# Patient Record
Sex: Female | Born: 1955
Health system: Southern US, Community
[De-identification: ages and names within clinical notes are randomized; demographics above are authoritative.]

## PROBLEM LIST (undated history)

## (undated) DIAGNOSIS — E669 Obesity, unspecified: Secondary | ICD-10-CM

## (undated) DIAGNOSIS — M797 Fibromyalgia: Secondary | ICD-10-CM

## (undated) DIAGNOSIS — R112 Nausea with vomiting, unspecified: Secondary | ICD-10-CM

## (undated) DIAGNOSIS — D510 Vitamin B12 deficiency anemia due to intrinsic factor deficiency: Secondary | ICD-10-CM

## (undated) DIAGNOSIS — F32A Depression, unspecified: Secondary | ICD-10-CM

## (undated) DIAGNOSIS — F329 Major depressive disorder, single episode, unspecified: Secondary | ICD-10-CM

## (undated) DIAGNOSIS — I10 Essential (primary) hypertension: Secondary | ICD-10-CM

## (undated) DIAGNOSIS — K8020 Calculus of gallbladder without cholecystitis without obstruction: Secondary | ICD-10-CM

## (undated) DIAGNOSIS — K2270 Barrett's esophagus without dysplasia: Secondary | ICD-10-CM

## (undated) DIAGNOSIS — Z9889 Other specified postprocedural states: Secondary | ICD-10-CM

## (undated) DIAGNOSIS — M1990 Unspecified osteoarthritis, unspecified site: Secondary | ICD-10-CM

## (undated) HISTORY — DX: Calculus of gallbladder without cholecystitis without obstruction: K80.20

## (undated) HISTORY — PX: EYE SURGERY: SHX253

## (undated) HISTORY — PX: TONSILLECTOMY: SUR1361

## (undated) HISTORY — PX: BREAST CYST EXCISION: SHX579

## (undated) HISTORY — PX: BARIATRIC SURGERY: SHX1103

## (undated) HISTORY — PX: ROTATOR CUFF REPAIR: SHX139

## (undated) HISTORY — DX: Obesity, unspecified: E66.9

## (undated) HISTORY — PX: OTHER SURGICAL HISTORY: SHX169

## (undated) HISTORY — DX: Fibromyalgia: M79.7

## (undated) HISTORY — DX: Essential (primary) hypertension: I10

## (undated) HISTORY — PX: TOTAL KNEE ARTHROPLASTY: SHX125

## (undated) HISTORY — DX: Barrett's esophagus without dysplasia: K22.70

## (undated) HISTORY — PX: VAGINAL HYSTERECTOMY: SUR661

## (undated) HISTORY — PX: CHOLECYSTECTOMY: SHX55

## (undated) HISTORY — DX: Vitamin B12 deficiency anemia due to intrinsic factor deficiency: D51.0

## (undated) HISTORY — DX: Unspecified osteoarthritis, unspecified site: M19.90

## (undated) HISTORY — PX: APPENDECTOMY: SHX54

## (undated) HISTORY — PX: CATARACT EXTRACTION: SUR2

---

## 1997-09-22 ENCOUNTER — Encounter: Payer: Self-pay | Admitting: Internal Medicine

## 2000-03-15 ENCOUNTER — Encounter (INDEPENDENT_AMBULATORY_CARE_PROVIDER_SITE_OTHER): Payer: Self-pay

## 2000-03-15 ENCOUNTER — Other Ambulatory Visit: Admission: RE | Admit: 2000-03-15 | Discharge: 2000-03-15 | Payer: Self-pay | Admitting: Internal Medicine

## 2000-04-18 ENCOUNTER — Ambulatory Visit (HOSPITAL_BASED_OUTPATIENT_CLINIC_OR_DEPARTMENT_OTHER): Admission: RE | Admit: 2000-04-18 | Discharge: 2000-04-18 | Payer: Self-pay | Admitting: Orthopedic Surgery

## 2003-02-19 ENCOUNTER — Inpatient Hospital Stay (HOSPITAL_COMMUNITY): Admission: RE | Admit: 2003-02-19 | Discharge: 2003-02-22 | Payer: Self-pay | Admitting: Orthopedic Surgery

## 2003-02-19 ENCOUNTER — Encounter: Payer: Self-pay | Admitting: Orthopedic Surgery

## 2004-01-07 ENCOUNTER — Ambulatory Visit (HOSPITAL_COMMUNITY): Admission: RE | Admit: 2004-01-07 | Discharge: 2004-01-07 | Payer: Self-pay | Admitting: Orthopedic Surgery

## 2004-05-01 ENCOUNTER — Emergency Department (HOSPITAL_COMMUNITY): Admission: EM | Admit: 2004-05-01 | Discharge: 2004-05-01 | Payer: Self-pay

## 2004-08-09 ENCOUNTER — Ambulatory Visit: Payer: Self-pay | Admitting: Internal Medicine

## 2004-08-25 ENCOUNTER — Ambulatory Visit: Payer: Self-pay | Admitting: Internal Medicine

## 2004-08-31 ENCOUNTER — Ambulatory Visit: Payer: Self-pay | Admitting: Internal Medicine

## 2004-10-19 ENCOUNTER — Ambulatory Visit: Payer: Self-pay | Admitting: Internal Medicine

## 2004-12-01 ENCOUNTER — Encounter (INDEPENDENT_AMBULATORY_CARE_PROVIDER_SITE_OTHER): Payer: Self-pay | Admitting: Specialist

## 2004-12-01 ENCOUNTER — Ambulatory Visit: Payer: Self-pay | Admitting: Internal Medicine

## 2005-01-09 ENCOUNTER — Ambulatory Visit: Payer: Self-pay | Admitting: Internal Medicine

## 2005-08-14 ENCOUNTER — Ambulatory Visit: Payer: Self-pay | Admitting: Internal Medicine

## 2005-08-15 ENCOUNTER — Ambulatory Visit: Payer: Self-pay | Admitting: Internal Medicine

## 2005-08-22 ENCOUNTER — Ambulatory Visit: Payer: Self-pay | Admitting: Internal Medicine

## 2005-08-23 ENCOUNTER — Ambulatory Visit: Payer: Self-pay | Admitting: Internal Medicine

## 2005-11-01 ENCOUNTER — Ambulatory Visit: Payer: Self-pay | Admitting: Internal Medicine

## 2006-09-21 ENCOUNTER — Ambulatory Visit: Payer: Self-pay | Admitting: Internal Medicine

## 2006-09-21 LAB — CONVERTED CEMR LAB
ALT: 39 units/L (ref 0–40)
AST: 25 units/L (ref 0–37)
Albumin: 3.4 g/dL — ABNORMAL LOW (ref 3.5–5.2)
Alkaline Phosphatase: 155 units/L — ABNORMAL HIGH (ref 39–117)
BUN: 9 mg/dL (ref 6–23)
Basophils Absolute: 0 10*3/uL (ref 0.0–0.1)
Basophils Relative: 0.3 % (ref 0.0–1.0)
Bilirubin, Direct: 0.1 mg/dL (ref 0.0–0.3)
CO2: 29 meq/L (ref 19–32)
Calcium: 8.7 mg/dL (ref 8.4–10.5)
Chloride: 109 meq/L (ref 96–112)
Cholesterol: 143 mg/dL (ref 0–200)
Creatinine, Ser: 0.6 mg/dL (ref 0.4–1.2)
Eosinophils Absolute: 0.2 10*3/uL (ref 0.0–0.6)
Eosinophils Relative: 3.4 % (ref 0.0–5.0)
Ferritin: 27.7 ng/mL (ref 10.0–291.0)
Folate: 11.5 ng/mL
GFR calc Af Amer: 136 mL/min
GFR calc non Af Amer: 112 mL/min
Glucose, Bld: 79 mg/dL (ref 70–99)
HCT: 41.1 % (ref 36.0–46.0)
HDL: 52.6 mg/dL (ref >39.0)
Hemoglobin: 13.9 g/dL (ref 12.0–15.0)
Hgb A1c MFr Bld: 4.8 % (ref 4.6–6.0)
Iron: 99 ug/dL (ref 42–145)
LDL Cholesterol: 76 mg/dL (ref 0–99)
Lymphocytes Relative: 29.7 % (ref 12.0–46.0)
MCHC: 33.7 g/dL (ref 30.0–36.0)
MCV: 90.2 fL (ref 78.0–100.0)
Monocytes Absolute: 0.4 10*3/uL (ref 0.2–0.7)
Monocytes Relative: 7.7 % (ref 3.0–11.0)
Neutro Abs: 3.3 10*3/uL (ref 1.4–7.7)
Neutrophils Relative %: 58.9 % (ref 43.0–77.0)
Platelets: 272 10*3/uL (ref 150–400)
Potassium: 3.4 meq/L — ABNORMAL LOW (ref 3.5–5.1)
RBC: 4.56 M/uL (ref 3.87–5.11)
RDW: 12.5 % (ref 11.5–14.6)
Saturation Ratios: 29.8 % (ref 20.0–50.0)
Sodium: 143 meq/L (ref 135–145)
TSH: 1.72 microintl units/mL (ref 0.35–5.50)
Total Bilirubin: 0.9 mg/dL (ref 0.3–1.2)
Total CHOL/HDL Ratio: 2.7
Total Protein: 6.3 g/dL (ref 6.0–8.3)
Transferrin: 237 mg/dL (ref >=212.0)
Triglycerides: 71 mg/dL (ref 0–149)
VLDL: 14 mg/dL (ref 0–40)
Vitamin B-12: 165 pg/mL — ABNORMAL LOW (ref 211–911)
WBC: 5.6 10*3/uL (ref 4.5–10.5)

## 2006-09-22 ENCOUNTER — Encounter: Payer: Self-pay | Admitting: Internal Medicine

## 2006-09-22 LAB — CONVERTED CEMR LAB: Zinc: 863 (ref 600–1200)

## 2006-09-25 ENCOUNTER — Ambulatory Visit: Payer: Self-pay | Admitting: Internal Medicine

## 2006-09-28 ENCOUNTER — Ambulatory Visit: Payer: Self-pay | Admitting: Internal Medicine

## 2006-10-22 ENCOUNTER — Ambulatory Visit: Payer: Self-pay | Admitting: Internal Medicine

## 2006-12-13 ENCOUNTER — Encounter: Payer: Self-pay | Admitting: Internal Medicine

## 2006-12-13 DIAGNOSIS — I10 Essential (primary) hypertension: Secondary | ICD-10-CM | POA: Insufficient documentation

## 2006-12-13 DIAGNOSIS — J309 Allergic rhinitis, unspecified: Secondary | ICD-10-CM | POA: Insufficient documentation

## 2006-12-13 DIAGNOSIS — K449 Diaphragmatic hernia without obstruction or gangrene: Secondary | ICD-10-CM | POA: Insufficient documentation

## 2006-12-13 DIAGNOSIS — K219 Gastro-esophageal reflux disease without esophagitis: Secondary | ICD-10-CM | POA: Insufficient documentation

## 2007-01-21 ENCOUNTER — Ambulatory Visit: Payer: Self-pay | Admitting: Internal Medicine

## 2007-01-21 LAB — CONVERTED CEMR LAB
GGT: 8 units/L (ref 7–51)
Vit D, 1,25-Dihydroxy: 8 — ABNORMAL LOW (ref 20–57)

## 2007-01-22 LAB — CONVERTED CEMR LAB
ALT: 24 units/L (ref 0–35)
AST: 21 units/L (ref 0–37)
Albumin: 3.4 g/dL — ABNORMAL LOW (ref 3.5–5.2)
Alkaline Phosphatase: 148 units/L — ABNORMAL HIGH (ref 39–117)
Bilirubin, Direct: 0.1 mg/dL (ref 0.0–0.3)
Potassium: 4.2 meq/L (ref 3.5–5.1)
Total Bilirubin: 0.6 mg/dL (ref 0.3–1.2)
Total Protein: 6 g/dL (ref 6.0–8.3)
Vitamin B-12: 841 pg/mL (ref 211–911)

## 2007-02-03 DIAGNOSIS — M2550 Pain in unspecified joint: Secondary | ICD-10-CM | POA: Insufficient documentation

## 2007-02-03 DIAGNOSIS — F411 Generalized anxiety disorder: Secondary | ICD-10-CM | POA: Insufficient documentation

## 2007-02-03 DIAGNOSIS — Z8679 Personal history of other diseases of the circulatory system: Secondary | ICD-10-CM | POA: Insufficient documentation

## 2007-02-03 DIAGNOSIS — E538 Deficiency of other specified B group vitamins: Secondary | ICD-10-CM | POA: Insufficient documentation

## 2007-02-03 DIAGNOSIS — K2270 Barrett's esophagus without dysplasia: Secondary | ICD-10-CM | POA: Insufficient documentation

## 2007-02-04 ENCOUNTER — Ambulatory Visit: Payer: Self-pay | Admitting: Internal Medicine

## 2007-02-04 DIAGNOSIS — E559 Vitamin D deficiency, unspecified: Secondary | ICD-10-CM | POA: Insufficient documentation

## 2007-02-04 DIAGNOSIS — R030 Elevated blood-pressure reading, without diagnosis of hypertension: Secondary | ICD-10-CM | POA: Insufficient documentation

## 2007-04-10 DIAGNOSIS — J180 Bronchopneumonia, unspecified organism: Secondary | ICD-10-CM | POA: Insufficient documentation

## 2007-04-11 ENCOUNTER — Ambulatory Visit: Payer: Self-pay | Admitting: Family Medicine

## 2007-04-15 ENCOUNTER — Telehealth: Payer: Self-pay | Admitting: Family Medicine

## 2007-04-18 ENCOUNTER — Encounter: Payer: Self-pay | Admitting: Internal Medicine

## 2007-06-03 ENCOUNTER — Ambulatory Visit: Payer: Self-pay | Admitting: Internal Medicine

## 2007-06-03 DIAGNOSIS — J029 Acute pharyngitis, unspecified: Secondary | ICD-10-CM | POA: Insufficient documentation

## 2007-06-03 DIAGNOSIS — D649 Anemia, unspecified: Secondary | ICD-10-CM | POA: Insufficient documentation

## 2007-06-03 DIAGNOSIS — R748 Abnormal levels of other serum enzymes: Secondary | ICD-10-CM | POA: Insufficient documentation

## 2007-06-03 LAB — CONVERTED CEMR LAB
ALT: 22 units/L (ref 0–35)
AST: 20 units/L (ref 0–37)
Albumin: 3.6 g/dL (ref 3.5–5.2)
Alkaline Phosphatase: 131 units/L — ABNORMAL HIGH (ref 39–117)
Basophils Absolute: 0 10*3/uL (ref 0.0–0.1)
Basophils Relative: 0.5 % (ref 0.0–1.0)
Bilirubin, Direct: 0.2 mg/dL (ref 0.0–0.3)
Eosinophils Absolute: 0.1 10*3/uL (ref 0.0–0.6)
Eosinophils Relative: 2 % (ref 0.0–5.0)
Ferritin: 44.4 ng/mL (ref 10.0–291.0)
HCT: 37.4 % (ref 36.0–46.0)
Hemoglobin: 12.9 g/dL (ref 12.0–15.0)
Lymphocytes Relative: 24 % (ref 12.0–46.0)
MCHC: 34.5 g/dL (ref 30.0–36.0)
MCV: 88.8 fL (ref 78.0–100.0)
Monocytes Absolute: 0.9 10*3/uL — ABNORMAL HIGH (ref 0.2–0.7)
Monocytes Relative: 12.4 % — ABNORMAL HIGH (ref 3.0–11.0)
Neutro Abs: 4.7 10*3/uL (ref 1.4–7.7)
Neutrophils Relative %: 61.1 % (ref 43.0–77.0)
Platelets: 236 10*3/uL (ref 150–400)
RBC: 4.21 M/uL (ref 3.87–5.11)
RDW: 12.5 % (ref 11.5–14.6)
Rapid Strep: NEGATIVE
Total Bilirubin: 0.8 mg/dL (ref 0.3–1.2)
Total Protein: 6.5 g/dL (ref 6.0–8.3)
Vitamin B-12: 995 pg/mL — ABNORMAL HIGH (ref 211–911)
WBC: 7.5 10*3/uL (ref 4.5–10.5)

## 2007-06-11 LAB — CONVERTED CEMR LAB: Vit D, 1,25-Dihydroxy: 12 — ABNORMAL LOW (ref 30–89)

## 2007-08-30 ENCOUNTER — Ambulatory Visit: Payer: Self-pay | Admitting: Internal Medicine

## 2007-08-30 LAB — CONVERTED CEMR LAB: Vit D, 1,25-Dihydroxy: 24 — ABNORMAL LOW (ref 30–89)

## 2007-09-05 ENCOUNTER — Ambulatory Visit: Payer: Self-pay | Admitting: Internal Medicine

## 2007-09-24 ENCOUNTER — Encounter: Payer: Self-pay | Admitting: Internal Medicine

## 2007-10-02 ENCOUNTER — Telehealth: Payer: Self-pay | Admitting: Internal Medicine

## 2007-10-03 ENCOUNTER — Ambulatory Visit: Payer: Self-pay | Admitting: Internal Medicine

## 2007-10-03 DIAGNOSIS — M25519 Pain in unspecified shoulder: Secondary | ICD-10-CM | POA: Insufficient documentation

## 2007-10-03 DIAGNOSIS — M797 Fibromyalgia: Secondary | ICD-10-CM | POA: Insufficient documentation

## 2007-10-04 ENCOUNTER — Telehealth: Payer: Self-pay | Admitting: Internal Medicine

## 2007-10-21 ENCOUNTER — Telehealth: Payer: Self-pay | Admitting: Internal Medicine

## 2007-10-23 ENCOUNTER — Ambulatory Visit: Payer: Self-pay | Admitting: Internal Medicine

## 2007-11-06 ENCOUNTER — Encounter: Admission: RE | Admit: 2007-11-06 | Discharge: 2007-11-06 | Payer: Self-pay | Admitting: Obstetrics and Gynecology

## 2007-11-19 ENCOUNTER — Observation Stay (HOSPITAL_COMMUNITY): Admission: RE | Admit: 2007-11-19 | Discharge: 2007-11-20 | Payer: Self-pay | Admitting: Obstetrics and Gynecology

## 2007-11-19 ENCOUNTER — Encounter (INDEPENDENT_AMBULATORY_CARE_PROVIDER_SITE_OTHER): Payer: Self-pay | Admitting: Obstetrics and Gynecology

## 2007-12-02 ENCOUNTER — Ambulatory Visit: Payer: Self-pay | Admitting: Internal Medicine

## 2007-12-02 LAB — CONVERTED CEMR LAB: Vit D, 1,25-Dihydroxy: 15 — ABNORMAL LOW (ref 30–89)

## 2007-12-03 ENCOUNTER — Encounter: Payer: Self-pay | Admitting: Internal Medicine

## 2007-12-05 LAB — CONVERTED CEMR LAB
ALT: 40 units/L — ABNORMAL HIGH (ref 0–35)
AST: 25 units/L (ref 0–37)
Albumin: 3.2 g/dL — ABNORMAL LOW (ref 3.5–5.2)
Alkaline Phosphatase: 93 units/L (ref 39–117)
BUN: 18 mg/dL (ref 6–23)
Bilirubin, Direct: 0.1 mg/dL (ref 0.0–0.3)
CO2: 29 meq/L (ref 19–32)
Calcium: 8.9 mg/dL (ref 8.4–10.5)
Chloride: 113 meq/L — ABNORMAL HIGH (ref 96–112)
Creatinine, Ser: 0.7 mg/dL (ref 0.4–1.2)
Free T4: 0.8 ng/dL (ref 0.6–1.6)
GFR calc Af Amer: 113 mL/min
GFR calc non Af Amer: 93 mL/min
Glucose, Bld: 93 mg/dL (ref 70–99)
Potassium: 3.7 meq/L (ref 3.5–5.1)
Sodium: 144 meq/L (ref 135–145)
TSH: 2.26 microintl units/mL (ref 0.35–5.50)
Total Bilirubin: 0.6 mg/dL (ref 0.3–1.2)
Total Protein: 5.8 g/dL — ABNORMAL LOW (ref 6.0–8.3)

## 2007-12-11 ENCOUNTER — Ambulatory Visit: Payer: Self-pay | Admitting: Internal Medicine

## 2008-01-07 ENCOUNTER — Telehealth (INDEPENDENT_AMBULATORY_CARE_PROVIDER_SITE_OTHER): Payer: Self-pay | Admitting: *Deleted

## 2008-01-09 ENCOUNTER — Ambulatory Visit (HOSPITAL_BASED_OUTPATIENT_CLINIC_OR_DEPARTMENT_OTHER): Admission: RE | Admit: 2008-01-09 | Discharge: 2008-01-09 | Payer: Self-pay | Admitting: Orthopedic Surgery

## 2008-07-26 ENCOUNTER — Emergency Department (HOSPITAL_COMMUNITY): Admission: EM | Admit: 2008-07-26 | Discharge: 2008-07-26 | Payer: Self-pay | Admitting: Emergency Medicine

## 2008-09-29 ENCOUNTER — Telehealth: Payer: Self-pay | Admitting: Internal Medicine

## 2008-10-07 ENCOUNTER — Ambulatory Visit: Payer: Self-pay | Admitting: Internal Medicine

## 2008-10-12 LAB — CONVERTED CEMR LAB
Mumps IgG: 2.72 — ABNORMAL HIGH
Rubella: 352 intl units/mL — ABNORMAL HIGH
Rubeola IgG: 2.41 — ABNORMAL HIGH
Varicella IgG: 3.57 — ABNORMAL HIGH

## 2008-10-28 ENCOUNTER — Encounter: Admission: RE | Admit: 2008-10-28 | Discharge: 2008-10-28 | Payer: Self-pay | Admitting: Orthopedic Surgery

## 2008-12-31 ENCOUNTER — Ambulatory Visit (HOSPITAL_COMMUNITY): Admission: RE | Admit: 2008-12-31 | Discharge: 2008-12-31 | Payer: Self-pay | Admitting: Orthopaedic Surgery

## 2009-02-01 ENCOUNTER — Telehealth: Payer: Self-pay | Admitting: *Deleted

## 2009-05-06 ENCOUNTER — Telehealth: Payer: Self-pay | Admitting: *Deleted

## 2009-09-06 ENCOUNTER — Ambulatory Visit: Payer: Self-pay | Admitting: Internal Medicine

## 2009-09-15 ENCOUNTER — Encounter: Payer: Self-pay | Admitting: Internal Medicine

## 2009-09-16 ENCOUNTER — Encounter: Payer: Self-pay | Admitting: Internal Medicine

## 2009-10-12 ENCOUNTER — Telehealth: Payer: Self-pay | Admitting: *Deleted

## 2009-10-18 ENCOUNTER — Ambulatory Visit: Payer: Self-pay | Admitting: Internal Medicine

## 2009-10-18 DIAGNOSIS — F4322 Adjustment disorder with anxiety: Secondary | ICD-10-CM | POA: Insufficient documentation

## 2009-10-18 DIAGNOSIS — R002 Palpitations: Secondary | ICD-10-CM | POA: Insufficient documentation

## 2009-11-19 ENCOUNTER — Ambulatory Visit: Payer: Self-pay | Admitting: Internal Medicine

## 2010-03-04 ENCOUNTER — Encounter: Admission: RE | Admit: 2010-03-04 | Discharge: 2010-03-04 | Payer: Self-pay | Admitting: Obstetrics and Gynecology

## 2010-04-07 ENCOUNTER — Encounter: Payer: Self-pay | Admitting: *Deleted

## 2010-04-07 ENCOUNTER — Telehealth: Payer: Self-pay | Admitting: *Deleted

## 2010-05-17 ENCOUNTER — Telehealth: Payer: Self-pay | Admitting: *Deleted

## 2010-08-07 ENCOUNTER — Encounter: Payer: Self-pay | Admitting: Orthopedic Surgery

## 2010-08-10 ENCOUNTER — Encounter: Payer: Self-pay | Admitting: *Deleted

## 2010-08-16 NOTE — Assessment & Plan Note (Signed)
Summary: fever/congestion/njr   Vital Signs:  Patient Profile:   55 Years Old Female Weight:      193 pounds (87.73 kg) Temp:     98.7 degrees F (37.06 degrees C) oral BP sitting:   150 / 82  (right arm)  Pt. in pain?   no  Vitals Entered By: Arcola Jansky, RN (April 11, 2007 12:26 PM)              Is Patient Diabetic? No     Chief Complaint:  last week had sore throat, ear ache, H/A, coughing then it got better ;    7-8pm yest .Temp. 101-103, chills, cough, runny nose, H/A, body aches, and sputum yellow.  History of Present Illness: Melanie [name] is a 55 year old female, who comes in today for evaluation of a fever.  Darl Pikes 916 2000, H. developed a sore throat, and congestion cold-like symptoms.  She was well until yesterday when she did spike a fever for hundred .3 and had increasing cough and chills.  She said since to yellow sputum production, but no blood.  She has no chest pain, earache, sore throat, nausea, vomiting, diarrhea, or urinary tract symptoms.  She says a number two coworker been sick everybody home as been well.  She is an ex-smoker  Acute Visit History:      She denies abdominal pain, chest pain, constipation, cough, diarrhea, earache, eye symptoms, fever, genitourinary symptoms, headache, musculoskeletal symptoms, nasal discharge, nausea, rash, sinus problems, sore throat, and vomiting.         Current Allergies (reviewed today): ! PCN     Review of Systems      See HPI   Physical Exam  General:     Well-developed,well-nourished,in no acute distress; alert,appropriate and cooperative throughout examination Head:     Normocephalic and atraumatic without obvious abnormalities. No apparent alopecia or balding. Eyes:     No corneal or conjunctival inflammation noted. EOMI. Perrla. Funduscopic exam benign, without hemorrhages, exudates or papilledema. Vision grossly normal. Ears:     External ear exam shows no significant lesions or deformities.   Otoscopic examination reveals clear canals, tympanic membranes are intact bilaterally without bulging, retraction, inflammation or discharge. Hearing is grossly normal bilaterally. Nose:     External nasal examination shows no deformity or inflammation. Nasal mucosa are pink and moist without lesions or exudates. Mouth:     Oral mucosa and oropharynx without lesions or exudates.  Teeth in good repair. Neck:     No deformities, masses, or tenderness noted. Chest Wall:     No deformities, masses, or tenderness noted. Lungs:     good breath sounds crackles, left base.  Chest x-ray normal Cervical Nodes:     No lymphadenopathy noted    Impression & Recommendations:  Problem # 1:  BRONCHOPNEUMONIA, ORGANISM NOS (ICD-485) Assessment: New  Her updated medication list for this problem includes:    Biaxin 500 Mg Tabs (Clarithromycin) .Marland Kitchen... Take 1 tablet by mouth two times a day   Complete Medication List: 1)  Cyanocobalamin 1000 Mcg/ml Soln (Cyanocobalamin) .... Administer 1 ml intramuscularly as directed 2)  Flexeril 10 Mg Tabs (Cyclobenzaprine hcl) .... Take as directed 3)  Monoject Safety Syringe/shield 23g X 1" 3 Ml Misc (Syringe/needle (disp)) .... Use as directed 4)  Paroxetine Hcl 40 Mg Tabs (Paroxetine hcl) .... Take 1 tablet by mouth once a day 5)  Tgt Alcohol Swabs 70 % Pads (Alcohol swabs) .... Apply as directed 6)  Vitamin D 16109 Unit  Caps (Ergocalciferol) .Marland Kitchen.. 1 by mouth once a week 7)  Biaxin 500 Mg Tabs (Clarithromycin) .... Take 1 tablet by mouth two times a day 8)  Hycodan 5-1.5 Mg/57ml Syrp (Hydrocodone-homatropine) .... 2 tsps tid   Patient Instructions: 1)  I think youdeveloped a secondary pneumonia from the underlying viralr illness.  Drink lots of liquids, rest at home, take Tylenol or aspirin for her fever and chills.  We will begin Biaxin 500 mg b.i.d. and Hycodan cough syrup one to 2 teaspoons t.i.d.  I think he should stay at home and rest today, Friday,  Saturday, and Sunday, and I think by Monday you should be able to go back to work.  Will give you a note to that effect.  Return p.r.n. if any problems    Prescriptions: HYCODAN 5-1.5 MG/5ML  SYRP (HYDROCODONE-HOMATROPINE) 2 tsps tid  #4 0z. x 2   Entered and Authorized by:   Roderick Pee MD   Signed by:   Roderick Pee MD on 04/11/2007   Method used:   Print then Give to Patient   RxID:   [id number] BIAXIN 500 MG  TABS (CLARITHROMYCIN) Take 1 tablet by mouth two times a day  #20 x 1   Entered and Authorized by:   Roderick Pee MD   Signed by:   Roderick Pee MD on 04/11/2007   Method used:   Print then Give to Patient   RxID:   [id number]  ]

## 2010-08-16 NOTE — Progress Notes (Signed)
Summary: fibromyalgia pain   Phone Note Call from Patient Call back at [phone]   Caller: patient vm triage Call For: Panosh  Summary of Call: Having a bad flair up of fibromyalgia.  Could Dr Fabian Sharp call in something for pain.  [facility]  Initial call taken by: Roselle Locus,  October 02, 2007 8:39 AM  Follow-up for Phone Call        please get more informtion clinical ? fever ? what hurts  .Pt may need to have ov   .   there is no standard med for FM if that is her dx  Follow-up by: wanda panosh  md   Additional Follow-up for Phone Call Additional follow up Details #1::        Pt is hurting all over and is convinced it is fibromylagia pain...Marland KitchenMarland KitchenWants pain meds to go with Flexeril.  Scheduled an office visit wth Dr. Fabian Sharp. Additional Follow-up by: Lynann Beaver CMA,  October 03, 2007 8:34 AM

## 2010-08-16 NOTE — Progress Notes (Signed)
Summary: needs titer for school  Phone Note Call from Patient Call back at Home Phone [phone]   Caller: Patient Summary of Call: Pt is needing a titer done for MMR for school. Is there any way we can do it here without her seeing md? Pt has no insurance right now and can't afford an office visit. Initial call taken by: Romualdo Bolk, CMA,  September 29, 2008 1:07 PM  Follow-up for Phone Call        ok to do but needs mor information to decide what titer to do . Follow-up by: [name] MD,  September 29, 2008 1:14 PM  Additional Follow-up for Phone Call Additional follow up Details #1::        Left message to call back.  Additional Follow-up by: Romualdo Bolk, CMA,  September 29, 2008 2:42 PM    Additional Follow-up for Phone Call Additional follow up Details #2::    LMTOCB Romualdo Bolk, CMA  October 07, 2008 1:01 PM Pt came in and needs both mmr titer and varicella titer.  Romualdo Bolk, CMA  October 07, 2008 4:00 PM

## 2010-08-16 NOTE — Progress Notes (Signed)
Summary: Rx refills  Phone Note Call from Patient   Reason for Call: Insurance Question Summary of Call: Patient requesting refills for her Paxil and Flexeril for 4 to 5 months until she can get a job. Patient states she has no insurance and she is about to finish school and hopes to have a job soon. Pharm/[facility]. Patient can be reached at [phone]. Initial call taken by: Darra Lis RMA,  February 01, 2009 3:50 PM  Follow-up for Phone Call        ok for 3 months  . then really needs an ov as she has not been seen in a year.  Follow-up by: [name] MD,  February 01, 2009 4:58 PM  Additional Follow-up for Phone Call Additional follow up Details #1::        LMTOCB- need to know how often she is taking the flexeril and that she needs to make an appt as soon as she can. Additional Follow-up by: Romualdo Bolk, CMA,  February 03, 2009 8:34 AM    Additional Follow-up for Phone Call Additional follow up Details #2::    Pt aware and rx sent electronically. Romualdo Bolk, CMA  February 03, 2009 1:04 PM   New/Updated Medications: PAROXETINE HCL 20 MG  TABS (PAROXETINE HCL) 2 by mouth daily Prescriptions: PAROXETINE HCL 20 MG  TABS (PAROXETINE HCL) 2 by mouth daily  #180 x 0   Entered by:   Romualdo Bolk, CMA   Authorized by:   [name] MD   Signed by:   Romualdo Bolk, CMA on 02/03/2009   Method used:   Electronically to        [facility]  [address] (retail)       [address]       [place], [place]  [address]       Ph: [phone]       Fax: [phone]   RxID:   [id number] FLEXERIL 10 MG TABS (CYCLOBENZAPRINE HCL) Take as directed  #90 x 3   Entered by:   Romualdo Bolk, CMA   Authorized by:   [name] MD   Signed by:   Romualdo Bolk, CMA on 02/03/2009   Method used:   Electronically to        [facility]  [address] [address] (retail)       [address]       [place], [place]  [address]       Ph:  [phone]       Fax: [phone]   RxID:   [id number]

## 2010-08-16 NOTE — Letter (Signed)
Summary: Generic Letter  [place] [facility]  [address] [place], [place] [address]   Phone: [phone]  Fax: [phone]    04/07/2010  Jordan Bastien [address] [place], [place]  [address]  Dear Ms. Shinsato,  We have been trying to contact you to schedule a follow up appt. In order to make sure that your are getting the correct medical care, you are due for a follow up appointment on your medication before your next refill. Please call our office at [phone] to schedule this appointment.          Sincerely,   Tor Netters, CMA (AAMA)

## 2010-08-16 NOTE — Progress Notes (Signed)
Summary: last seen 9/25, still not doing well  Phone Note Call from Patient Call back at Home Phone [phone] Call back at [phone]   Caller: Patient Call For: [name] Summary of Call: pt was in on 9/25 and seen by Dr Tawanna Cooler. pt has pneumonia and still feeling bad today C/O coughing, feeling weak, not being able to eat or drink  because of her mouth. Need to know if she need to be seen again today. Pls call concerned pt Initial call taken by: Shan Levans,  April 15, 2007 10:09 AM  Follow-up for Phone Call        patient Dr. Suzzanne Cloud I saw last week with a viral infection.  Show normal.  Chest x-ray, but crackles consistent with either early bacterial, viral or possible mycoplasma pneumonia.  She was placed and in the eye.  She is calling because her chest.  Some soreness in her mouth.  She was given Magic mouthwash to Dr. Daisey Must is doing okay.  She is afebrile and otherwise well.  Advised to return to see Dr. Suzzanne Cloud her primary care physician if she has any further questions Follow-up by: Roderick Pee MD,  April 15, 2007 12:52 PM

## 2010-08-16 NOTE — Progress Notes (Signed)
Summary: refill  Phone Note From Pharmacy   Caller: [facility]  [address] Reason for Call: Needs renewal Details for Reason: Paroxetine 20mg  Summary of Call: Pt needs a rov before next refill.  Initial call taken by: Romualdo Bolk, CMA Duncan Dull),  April 07, 2010 10:51 AM  Follow-up for Phone Call        Tried to call pt about scheduling an appt but this was the wrong number.  Tried other numbers but couldn't get ahold of her. I mailed her a letter to call us back to schedule a follow up appt. Rx sent for 1 month only. Follow-up by: Romualdo Bolk, CMA (AAMA),  April 07, 2010 1:18 PM    Prescriptions: PAROXETINE HCL 40 MG TABS (PAROXETINE HCL) Take 1 tablet by mouth once a day  #30 x 0   Entered by:   Romualdo Bolk, CMA (AAMA)   Authorized by:   [name] MD   Signed by:   Romualdo Bolk, CMA (AAMA) on 04/07/2010   Method used:   Electronically to        [facility]  [address] [address] (retail)       [address]       [place], [place]  [address]       Ph: [phone]       Fax: [phone]   RxID:   [id number]

## 2010-08-16 NOTE — Progress Notes (Signed)
Summary: allergic reaction  Phone Note Call from Patient   Caller: Patient Call For: Dr. Fabian Sharp Summary of Call: Pt. took Lyrica last night and has a rash on her chest. What should she do?  Told her to hold it until she hears from [place]. [phone] Initial call taken by: Lynann Beaver CMA,  October 04, 2007 10:31 AM  Follow-up for Phone Call        Per md- What does it look like? Does it itch? How much of her body is it on? Spoke to pt- big red whelps, it doesn't itch, and it just on the top of her chest.  Follow-up by: Romualdo Bolk, CMA,  October 04, 2007 10:48 AM  Additional Follow-up for Phone Call Additional follow up Details #1::        Per md- not sure if it's the meds, stop meds and take benadryl. If rash goes away, try again. If it comes back or you start to get SOB then d/c. Usually if having a reaction you will have itching. Dr. Fabian Sharp is on call this weekend. Pt aware of this and will call back if she needs anything. Additional Follow-up by: Romualdo Bolk, CMA,  October 04, 2007 11:05 AM

## 2010-08-16 NOTE — Progress Notes (Signed)
Summary: Office Visit  Office Visit   Imported By: Kassie Mends 12/03/2007 10:39:37  _____________________________________________________________________  External Attachment:    Type:   Image     Comment:   office note

## 2010-08-16 NOTE — Assessment & Plan Note (Signed)
Summary: discuss meds/ssc   Vital Signs:  Patient profile:   55 year old female Menstrual status:  hysterectomy Weight:      218 pounds Pulse rate:   113 / minute BP sitting:   130 / 90  (left arm) Cuff size:   large  Vitals Entered By: Romualdo Bolk, CMA (AAMA) (October 18, 2009 2:45 PM) CC: Discuss meds, Hypertension Management     Menstrual Status hysterectomy   History of Present Illness: Melanie [name] comesin today for   for above. She is reestablishing with [place] .  Last ov 5 09  She had had her check up with Dr Selena Batten at [facility] where she has worked since November but for various reasons  wishes to come back here for care.  Last ov was    REcords from Dr Reuel Derby evaluation with labs and ekg were sent to [place].   Labs nl except vit d is 9.6  was given VIt d rx but not taking this  Bp has been controlled .  Jan 2010  fracture   foot   twisted     left shoulder  bone spur  taken off June 2009 then rotator cuff  summer 2010. GI :   ? if needs egd   didnt  see on the last one.  Has Barretts esophagus  Feels panicy again  and / if  needs to bump up   her medication .   Recently     had taken CMA exam and was stressful but did ok .    Sleep ok.       Hypertension History:      She complains of palpitations, but denies headache, chest pain, dyspnea with exertion, orthopnea, PND, peripheral edema, visual symptoms, neurologic problems, syncope, and side effects from treatment.  She notes no problems with any antihypertensive medication side effects.  Heart palps like when she had panic attacks.        Positive major cardiovascular risk factors include hypertension.  Negative major cardiovascular risk factors include female age less than 50 years old and non-tobacco-user status.     Preventive Screening-Counseling & Management  Alcohol-Tobacco     Alcohol drinks/day: 0     Smoking Status: quit     Year Quit: 1994  Caffeine-Diet-Exercise     Caffeine use/day: 1     Does  Patient Exercise: yes     Type of exercise: walking  Hep-HIV-STD-Contraception     Dental Visit-last 6 months no  Safety-Violence-Falls     Seat Belt Use: 100     Firearms in the Home: firearms in the home     Firearm Counseling: not indicated; uses recommended firearm safety measures     Smoke Detectors: yes      Blood Transfusions:  no.    Current Medications (verified): 1)  Cyanocobalamin 1000 Mcg/ml Soln (Cyanocobalamin) .... Administer 1 Ml Intramuscularly As Directed 2)  Flexeril 10 Mg Tabs (Cyclobenzaprine Hcl) .... Take As Directed 3)  Monoject Safety Syringe/shield 23g X 1" 3 Ml Misc (Syringe/needle (Disp)) .... Use As Directed 4)  Paroxetine Hcl 40 Mg Tabs (Paroxetine Hcl) .... Take 1 Tablet By Mouth Once A Day 5)  Tgt Alcohol Swabs 70 % Pads (Alcohol Swabs) .... Apply As Directed 6)  Vitamin D 29562 Unit  Caps (Ergocalciferol) .Marland Kitchen.. 1 By Mouth 2 X Per Week or As Directed 7)  Maxzide-25 37.5-25 Mg  Tabs (Triamterene-Hctz) .... Take 1/2 By Mouth Once Daily or As Directed  8)  Paroxetine Hcl 20 Mg  Tabs (Paroxetine Hcl) .... 2 By Mouth Daily 9)  Vitamin B-12 1000 Mcg Subl (Cyanocobalamin)  Allergies (verified): 1)  ! Pcn 2)  * Ace Inhibitor?  Past History:  Past medical, surgical, family and social histories (including risk factors) reviewed, and no changes noted (except as noted below).  Past Medical History: Allergic rhinitis GERD Hypertension barretts esophagus fibromyalgia Low b12 from bypass Vit d deficiency   Past Surgical History: Reviewed history from 12/11/2007 and no changes required. Cholecystectomy Hysterectomy 96 endometriosis Tonsillectomy Breast Biopsy in 1990 and 1997 Ganglion Cyst removed Gastric bypass 06 mini Total knee replacement l 04 Caesarean section  x2  ovarian cyst tumor removal   Past History:  Care Management: Orthopedics: [facility] and [facility] Gynecology:[facility]  Gastroenterology:[facility].  [facility] ;  [facility] [place]  [place]   Family History: Reviewed history from 10/23/2007 and no changes required. Family History of Colon CA 1st degree relative  mom Family History Hypertension mom  on med and had cva in 34s.   Family History of Stroke F 1st degree relative mom 91 Family History of Digestive disorder Fa esophageal ca  Social History: Reviewed history from 12/11/2007 and no changes required. Married Former Smoker works [employer].  works   as a Clinical biochemist   Caffeine use/day:  1 Does Patient Exercise:  yes Dental Care w/in 6 mos.:  no Blood Transfusions:  no  Review of Systems  The patient denies anorexia, fever, weight loss, weight gain, vision loss, decreased hearing, hoarseness, chest pain, dyspnea on exertion, peripheral edema, prolonged cough, headaches, abdominal pain, melena, hematochezia, hematuria, difficulty walking, abnormal bleeding, enlarged lymph nodes, and angioedema.    Physical Exam  General:  Well-developed,well-nourished,in no acute distress; alert,appropriate and cooperative throughout examination Head:  normocephalic and atraumatic.   Eyes:  vision grossly intact, pupils equal, and pupils round.   Ears:  no external deformities.   Nose:  no external deformity and no nasal discharge.   Mouth:  pharynx pink and moist.   Neck:  No deformities, masses, or tenderness noted. Lungs:  Normal respiratory effort, chest expands symmetrically. Lungs are clear to auscultation, no crackles or wheezes.no dullness.   Heart:  Normal rate and regular rhythm. S1 and S2 normal without gallop, murmur, click, rub or other extra sounds.no lifts.   Abdomen:  Bowel sounds positive,abdomen soft and non-tender without masses, organomegaly or   noted. Pulses:  pulses intact without delay   Extremities:  no clubbing cyanosis or edema  Neurologic:  alert & oriented X3 and gait normal.   no tremor Skin:  turgor normal, no rashes, no ecchymoses, and no petechiae.   Cervical Nodes:  No  lymphadenopathy noted Psych:  Oriented X3, normally interactive, not depressed appearing, and slightly anxious.   ekg and labs from [facility]  nsr rate 77 nl intervals  Impression & Recommendations:  Problem # 1:  PALPITATIONS (ICD-785.1)  ? if panic or not ekg and lytes have been nl  intervals nl  but has ht and risk  .   Orders: Cardiology Referral (Cardiology)  Problem # 2:  HYPERTENSION (ICD-401.9)  borderline today  Her updated medication list for this problem includes:    Maxzide-25 37.5-25 Mg Tabs (Triamterene-hctz) .Marland Kitchen... Take 1/2 by mouth once daily or as directed  BP today: 130/90 Prior BP: 120/80 (12/11/2007)  10 Yr Risk Heart Disease: 7 %  Labs Reviewed: K+: 3.7 (12/02/2007) Creat: : 0.7 (12/02/2007)  Chol: 143 (09/21/2006)   HDL: 52.6 (09/21/2006)   LDL: 76 (09/21/2006)   TG: 71 (09/21/2006)  Orders: Cardiology Referral (Cardiology)  Problem # 3:  UNSPECIFIED VITAMIN D DEFICIENCY (ICD-268.9) hx of bypass surgery      Problem # 4:  B12 DEFICIENCY (ICD-266.2) bypass surgery  get back on rx   Problem # 5:  BARRETT'S ESOPHAGUS (ICD-530.85) unclear when last egd done .    Problem # 6:  ADJUSTMENT DISORDER WITH ANXIETY (ICD-309.24) ok to increase the paxil and follow up .  Complete Medication List: 1)  Cyanocobalamin 1000 Mcg/ml Soln (Cyanocobalamin) .... Administer 1 ml intramuscularly as directed 2)  Flexeril 10 Mg Tabs (Cyclobenzaprine hcl) .... Take as directed 3)  Monoject Safety Syringe/shield 23g X 1" 3 Ml Misc (Syringe/needle (disp)) .... Use as directed 4)  Paroxetine Hcl 40 Mg Tabs (Paroxetine hcl) .... Take 1 tablet by mouth once a day 5)  Tgt Alcohol Swabs 70 % Pads (Alcohol swabs) .... Apply as directed 6)  Vitamin D 16109 Unit Caps (Ergocalciferol) .Marland Kitchen.. 1 by mouth 2 x per week or as directed 7)  Maxzide-25 37.5-25 Mg Tabs (Triamterene-hctz) .... Take 1/2 by mouth once daily or as directed 8)  Paroxetine Hcl 20 Mg Tabs (Paroxetine hcl) .... 3 by mouth  daily 9)  Vitamin B-12 1000 Mcg Subl (Cyanocobalamin)  Hypertension Assessment/Plan:      The patient's hypertensive risk group is category A: No risk factors and no target organ damage.  Her calculated 10 year risk of coronary heart disease is 7 %.  Today's blood pressure is 130/90.  Her blood pressure goal is < 140/90.  Patient Instructions: 1)  ok to restart  B12 shots  for now 2)  sublingulal vit d  2000 international units per day 3)  will call about your echo test. 4)  Increase  paxil to 60 mg per day . 5)  ROV in about  a  month  ( ok to use  friday afternoon  sda if rov Not available)  Prescriptions: CYANOCOBALAMIN 1000 MCG/ML SOLN (CYANOCOBALAMIN) Administer 1 ml intramuscularly as directed  #1 x 3   Entered and Authorized by:   [name] MD   Signed by:   [name] MD on 10/18/2009   Method used:   Electronically to        [facility]  [address] [address] (retail)       [address]       [place], [place]  [address]       Ph: [phone]       Fax: [phone]   RxID:   [id number] PAROXETINE HCL 20 MG  TABS (PAROXETINE HCL) 3 by mouth daily  #90 x 1   Entered and Authorized by:   [name] MD   Signed by:   [name] MD on 10/18/2009   Method used:   Electronically to        [facility] [address] [facility]ail)       [address]       [place], [place]  [address]       Ph: [phone]       Fax: [phone]   RxID:   [id number]

## 2010-08-16 NOTE — Assessment & Plan Note (Signed)
Summary: 3 month f/u//ca   Vital Signs:  Patient Profile:   55 Years Old Female Weight:      191 pounds Pulse rate:   60 / minute BP sitting:   120 / 80  (left arm) Cuff size:   regular  Vitals Entered By: Romualdo Bolk, CMA (Dec 11, 2007 1:07 PM)                 Chief Complaint:  Follow up.  History of Present Illness: Melanie [name] is here for a follow up on labs.and BP   BP taking 1/2 pill of diuretic and getting in 120/80 range .  no side effect of meds  Is about 4 weeks post op ovaian benign tumor cyst and doing well  less exercise recently but no new symptom   back to work part time. FM:  Ran out of lyrica x 1 week and feels ok    ? need to continue   Mood:   ok need refill of paxil VIT d has been on high dose x almost 9 mos    Pt reviewed medication list and no changes made.     Prior Medications Reviewed Using: Patient Recall  Prior Medication List:  CYANOCOBALAMIN 1000 MCG/ML SOLN (CYANOCOBALAMIN) Administer 1 ml intramuscularly as directed FLEXERIL 10 MG TABS (CYCLOBENZAPRINE HCL) Take as directed MONOJECT SAFETY SYRINGE/SHIELD 23G X 1" 3 ML MISC (SYRINGE/NEEDLE (DISP)) use as directed PAROXETINE HCL 40 MG TABS (PAROXETINE HCL) Take 1 tablet by mouth once a day TGT ALCOHOL SWABS 70 % PADS (ALCOHOL SWABS) Apply as directed VITAMIN D 60454 UNIT  CAPS (ERGOCALCIFEROL) 1 by mouth once a week LYRICA 50 MG  CAPS (PREGABALIN) 1 by mouth hs and then increase to two times a day and then three times a day for pain MAXZIDE-25 37.5-25 MG  TABS (TRIAMTERENE-HCTZ) take 1/2 by mouth once daily or as directed   Current Allergies (reviewed today): ! PCN * ACE INHIBITOR?  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     Physical Exam  General:     Well-developed,well-nourished,in no acute distress; alert,appropriate and cooperative throughout examination Lungs:     normal respiratory effort and no intercostal retractions.   Heart:     Normal rate and regular rhythm. S1 and S2 normal without gallop, murmur, click, rub or other extra sounds. Abdomen:     Bowel sounds positive,abdomen soft and non-tender without masses, organomegaly or hernias noted. Extremities:     no cce  Cervical Nodes:     No lymphadenopathy noted Psych:     normally interactive and good eye contact.      Impression & Recommendations:  Problem # 1:  HYPERTENSION (ICD-401.9)  continue  monitoring Her updated medication list for this problem includes:    Maxzide-25 37.5-25 Mg Tabs (Triamterene-hctz) .Marland Kitchen... Take 1/2 by mouth once daily or as directed   Problem # 2:  UNSPECIFIED VITAMIN D DEFICIENCY (ICD-268.9) increase dosing   ? poor absorption     Problem # 3:  FIBROMYALGIA (ICD-729.1) ok to try off lyrica  Her updated medication list for this problem includes:    Flexeril 10 Mg Tabs (Cyclobenzaprine hcl) .Marland Kitchen... Take as directed   Problem # 4:  B12 DEFICIENCY (ICD-266.2) continue  Problem # 5:  ALKALINE PHOSPHATASE, ELEVATED (ICD-790.5) Assessment: Improved follow   Complete Medication List: 1)  Cyanocobalamin 1000 Mcg/ml Soln (Cyanocobalamin) .... Administer 1 ml intramuscularly as directed 2)  Flexeril 10 Mg Tabs (Cyclobenzaprine hcl) .... Take as directed 3)  Monoject Safety Syringe/shield 23g X 1" 3 Ml Misc (Syringe/needle (disp)) .... Use as directed 4)  Paroxetine Hcl 40 Mg Tabs (Paroxetine hcl) ....  Take 1 tablet by mouth once a day 5)  Tgt Alcohol Swabs 70 % Pads (Alcohol swabs) .... Apply as directed 6)  Vitamin D 40102 Unit Caps (Ergocalciferol) .Marland Kitchen.. 1 by mouth 2 x per week or as directed 7)  Lyrica 50 Mg Caps (Pregabalin) .Marland Kitchen.. 1 by mouth hs and then increase to two times a day and then three times a day for pain 8)  Maxzide-25 37.5-25 Mg Tabs (Triamterene-hctz) .... Take 1/2 by mouth once daily or as directed   Patient Instructions: 1)  take vit d 2x per week  2)  continue BLood pressure medication 3)  vitamin   D  and lfts  previsit 790.4, 733.9 4)  Please schedule a follow-up appointment in 4 -75month .   Prescriptions: MAXZIDE-25 37.5-25 MG  TABS (TRIAMTERENE-HCTZ) take 1/2 by mouth once daily or as directed  #90 x 3   Entered and Authorized by:   [name] MD   Signed by:   [name] MD on 12/11/2007   Method used:   Electronically sent to ...       [facility] [address] [facility]       [address]       [place], [place]  [address]       Ph: [phone]       Fax: [phone]   RxID:   [id number] FLEXERIL 10 MG TABS (CYCLOBENZAPRINE HCL) Take as directed  #90 x 3   Entered and Authorized by:   [name] MD   Signed by:   [name] MD on 12/11/2007   Method used:   Electronically sent to ...       [facility] [address] [facility]       [address]       [place], [place]  [address]       Ph: [phone]       Fax: [phone]   RxID:   [id number] PAROXETINE HCL 40 MG TABS (PAROXETINE HCL) Take 1 tablet by mouth once a day  #90 x 3   Entered and Authorized by:   [name] MD   Signed by:   [name] MD on 12/11/2007   Method used:   Electronically sent to ...       [facility] [address] [facility]       [address]       [place]  [place], [place]  [address]       Ph: [phone]       Fax: [phone]   RxID:   [id number] VITAMIN D 50000 UNIT  CAPS (ERGOCALCIFEROL) 1 by mouth 2 x per week or as directed   #24 x 2   Entered and Authorized by:   [name] MD   Signed by:   [name] MD on 12/11/2007   Method used:   Electronically sent to ...       [facility]       [address]       [place], [place]  [address]       Ph: [phone]       Fax: [phone]   RxID:   [id number]  ]

## 2010-08-16 NOTE — Progress Notes (Signed)
Summary: refills on paxils and flexeril  Phone Note Call from Patient Call back at Home Phone [phone]   Caller: Patient Summary of Call: Pt called and left a message saying that she needs a refill on both paxil and flexeril. Pt has just started a new job and doesn't have ins. until Feb but will schedule an appt for then for a cpx. I called pt back and left a message to call back to go ahead an schedule a cpx for then so she is on the schedule. Initial call taken by: Romualdo Bolk, CMA (AAMA),  May 06, 2009 11:40 AM  Follow-up for Phone Call        her last ov was may 2009   can refill x 3 months but needs  beforea ny more refills.  Also  please update her medication list as it could be inaccurate . Follow-up by: [name] MD,  May 06, 2009 6:04 PM  Additional Follow-up for Phone Call Additional follow up Details #1::        Left message to call back. Rx's sent electronically. Additional Follow-up by: Romualdo Bolk, CMA (AAMA),  May 07, 2009 2:09 PM    Additional Follow-up for Phone Call Additional follow up Details #2::    Left message to call back. Romualdo Bolk, CMA (AAMA)  May 10, 2009 3:54 PM LMTOCB Romualdo Bolk, CMA Duncan Dull)  May 17, 2009 8:40 AM LMTOCB Romualdo Bolk, CMA Duncan Dull)  May 21, 2009 1:20 PM Pt has an appt in Feb 2011. According to EMR but pt never called back. Romualdo Bolk, CMA (AAMA)  May 25, 2009 8:29 AM   Prescriptions: FLEXERIL 10 MG TABS (CYCLOBENZAPRINE HCL) Take as directed  #90 x 2   Entered by:   Romualdo Bolk, CMA (AAMA)   Authorized by:   [name] MD   Signed by:   Romualdo Bolk, CMA (AAMA) on 05/07/2009   Method used:   Electronically to        [facility] [address] [facility] (retail)       [address]       [place], [place]  [address]       Ph: [phone]       Fax: [phone]   RxID:   [id number]

## 2010-08-16 NOTE — Assessment & Plan Note (Signed)
Summary: BP CHECKED/CCM   Vital Signs:  Patient Profile:   55 Years Old Female Weight:      184.5 pounds Temp:     98.1 degrees F oral BP sitting:   160 / 98  (left arm) Cuff size:   regular                 Chief Complaint:  BP elevated at OB/GYN office 150/100.  History of Present Illness: Melanie [name] comes in today for elevated bp readings   150- 160   range   Has large ovarian cyst and may need surgery to remove ovaries,,, Remote hx of ? cough with ace when on bp ,meds before   surgery    Lyrica helps  pain. Does get hot flushes ocassionaly NO cp sob or edema.  Get dizzy at times when gets up fast on lyrica but not really a problem. 8 hours   sleep s ok .     Current Allergies: ! PCN  Past Medical History:    Reviewed history from 02/04/2007 and no changes required:       Allergic rhinitis       GERD       Hypertension                Past Surgical History:    Reviewed history from 02/03/2007 and no changes required:       Cholecystectomy       Hysterectomy 96 endometriosis       Tonsillectomy       Breast Biopsy in 1990 and 1997       Ganglion Cyst removed       Gastric bypass 06 mini       Total knee replacement l 04       Caesarean section  x2   Family History:    Family History of Colon CA 1st degree relative  mom    Family History Hypertension mom  on med and had cva in 75s.      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         some hot flushes   Physical Exam  General:     alert, well-developed, well-nourished, and well-hydrated.   Head:     normocephalic and atraumatic.   Neck:     supple, full ROM, and no masses.    Lungs:     Normal respiratory effort, chest expands symmetrically. Lungs are clear to auscultation, no crackles or wheezes. Heart:     normal rate, regular rhythm, and no murmur.   Extremities:     no cce  Skin:     turgor normal, color normal, and no ecchymoses.   Cervical Nodes:     no anterior cervical adenopathy and no posterior cervical adenopathy.   Psych:     normally interactive and good eye contact.     Serial Vital Signs/Assessments:  Time      Position  BP       Pulse  Resp  Temp     By  R Arm     142/90                         [name] MD           L Arm     148/96                         [name] MD  Comments: reg cuff sitting By: [name] MD     Impression & Recommendations:  Problem # 1:  HYPERTENSION (ICD-401.9) ? hx of cough with ace in the remote past     will try low dose diuretic and follow k      Her updated medication list for this problem includes:    Maxzide-25 37.5-25 Mg Tabs (Triamterene-hctz) .Marland Kitchen... Take 1/2 by mouth once daily or as directed   Complete Medication List: 1)  Cyanocobalamin 1000 Mcg/ml Soln (Cyanocobalamin) .... Administer 1 ml intramuscularly as directed 2)  Flexeril 10 Mg Tabs (Cyclobenzaprine hcl) .... Take as directed 3)  Monoject Safety Syringe/shield 23g X 1" 3 Ml Misc (Syringe/needle (disp)) .... Use as directed 4)  Paroxetine Hcl 40 Mg Tabs (Paroxetine hcl) .... Take 1 tablet by mouth once a day 5)  Tgt Alcohol Swabs 70 % Pads (Alcohol swabs) .... Apply as directed 6)  Vitamin D 16109 Unit Caps (Ergocalciferol) .Marland Kitchen.. 1 by mouth once a week 7)  Lyrica 50 Mg Caps (Pregabalin) .Marland Kitchen.. 1 by mouth hs and then increase to two times a day and then three times a day for pain 8)  Maxzide-25 37.5-25 Mg Tabs (Triamterene-hctz) .... Take 1/2 by mouth once daily or as directed   Patient Instructions: 1)  check your bp readings and we keep follow up appt. in about a month 2)  get own cuff for home monitoring.       Prescriptions: MAXZIDE-25 37.5-25 MG  TABS (TRIAMTERENE-HCTZ) take 1/2 by mouth once daily or as directed  #30 x 3   Entered and Authorized by:   [name] MD   Signed by:   [name] MD on 10/23/2007   Method used:   Electronically sent to ...       [facility] [address] [facility]       [address]       [place], [place]  [address]       Ph: [phone]       Fax: [phone]   RxID:   [id number]  ]

## 2010-08-16 NOTE — Progress Notes (Signed)
Summary: [facility]-Instructions for B-12 Shots  [facility]-Instructions for B-12 Shots   Imported By: Maryln Gottron 12/30/2009 15:36:39  _____________________________________________________________________  External Attachment:    Type:   Image     Comment:   External Document

## 2010-08-16 NOTE — Progress Notes (Signed)
Summary: REQ FOR RETURN CALL  Phone Note Call from Patient Call back at [phone]   Caller: Patient  [phone] Reason for Call: Talk to Nurse Summary of Call: Pt called to speak with Carollee Herter, CMA.... Pt called to adv that she has been taking paxil for a while and she wanted to know if she can increase the dosage because she has been exp panic attacks more often and crying easily..... Pt was offered OV but she adv she would rather speak with Carollee Herter, CMA first.  Initial call taken by: Debbra Riding,  October 12, 2009 9:45 AM  Follow-up for Phone Call        Spoke to and she has started having panic attacks again. She has started crying more and then gets mad more easy. Pt states that she has never had a temper. She states that she gets more anxious. Pt would like to increase her dosage to 60mg  a day then come in for a follow up if possible. Follow-up by: Romualdo Bolk, CMA Duncan Dull),  October 12, 2009 10:13 AM  Additional Follow-up for Phone Call Additional follow up Details #1::        Per Dr. Fabian Sharp- Pt needs a rov 1st before we can increase her medication. Monday at 1:45pm. Can give enough meds to get thru until then. LMTOCB Additional Follow-up by: Romualdo Bolk, CMA Duncan Dull),  October 12, 2009 10:24 AM    Additional Follow-up for Phone Call Additional follow up Details #2::    Pt aware and appt made Follow-up by: Romualdo Bolk, CMA Duncan Dull),  October 12, 2009 10:47 AM

## 2010-08-16 NOTE — Consult Note (Signed)
Summary: [facility]   Imported By: Lanelle Bal 12/23/2007 11:37:08  _____________________________________________________________________  External Attachment:    Type:   Image     Comment:   External Document

## 2010-08-16 NOTE — Assessment & Plan Note (Signed)
Summary: FU/ON LABS//VN   Vital Signs:  Patient Profile:   55 Years Old Female Weight:      188 pounds Pulse rate:   78 / minute BP sitting:   120 / 80  (left arm) Cuff size:   regular  Vitals Entered By: Romualdo Bolk, CMA (September 05, 2007 10:35 AM)                 Serial Vital Signs/Assessments:  Time      Position  BP       Pulse  Resp  Temp     By                     128/78                         [name] MD   Chief Complaint:  Follow up.  History of Present Illness: Melanie [name] is here for a follow up on labs.For vit d and b12 deficiency.  she  is on home injections.  Pt needs a rx on vitamin d and cyanocobalamin. She is feeling ok but had ocassional palitations in the pst . none for 1 week.  ? if needs more paxil or not.   No etoh and rare caffiene.     Prior Medications Reviewed Using: Patient Recall  Prior Medication List:  CYANOCOBALAMIN 1000 MCG/ML SOLN (CYANOCOBALAMIN) Administer 1 ml intramuscularly as directed FLEXERIL 10 MG TABS (CYCLOBENZAPRINE HCL) Take as directed MONOJECT SAFETY SYRINGE/SHIELD 23G X 1" 3 ML MISC (SYRINGE/NEEDLE (DISP)) use as directed PAROXETINE HCL 40 MG TABS (PAROXETINE HCL) Take 1 tablet by mouth once a day TGT ALCOHOL SWABS 70 % PADS (ALCOHOL SWABS) Apply as directed VITAMIN D 21308 UNIT  CAPS (ERGOCALCIFEROL) 1 by mouth once a week   Current Allergies (reviewed today): ! PCN  Past Medical History:    Reviewed history from 02/04/2007 and no changes required:       Allergic rhinitis       GERD       Hypertension                Past Surgical History:    Reviewed history from 02/03/2007 and no changes required:       Cholecystectomy       Hysterectomy 96 endometriosis       Tonsillectomy       Breast Biopsy in 1990 and 1997       Ganglion Cyst removed       Gastric bypass 06 mini       Total knee replacement l 04       Caesarean section  x2   Family History:    Reviewed history from 02/03/2007 and no  changes required:       Family History of Colon CA 1st degree relative  mom       Family History Hypertension       Family History of Stroke F 1st degree relative mom 62       Family History of Digestive disorder Fa esophageal ca  Social History:    Reviewed history from 02/03/2007 and no changes required:       Married       Former Smoker   Risk Factors: Tobacco use:  quit Alcohol use:  no Exercise:  no Seatbelt use:  100 %  Colonoscopy History:    Date of Last Colonoscopy:  07/17/1994   Review  of Systems  The patient denies anorexia, fever, chest pain, dyspnea on exhertion, prolonged cough, and abdominal pain.     Physical Exam  General:     Well-developed,well-nourished,in no acute distress; alert,appropriate and cooperative throughout examination Neck:     No deformities, masses, or tenderness noted. Lungs:     Normal respiratory effort, chest expands symmetrically. Lungs are clear to auscultation, no crackles or wheezes. Heart:     Normal rate and regular rhythm. S1 and S2 normal without gallop, murmur, click, rub or other extra sounds. Extremities:     no cce Neurologic:     non focal Skin:     turgor normal, color normal, and no rashes.   Cervical Nodes:     no anterior cervical adenopathy and no posterior cervical adenopathy.   Psych:     Oriented X3 and good eye contact.   Additional Exam:     see labs    Impression & Recommendations:  Problem # 1:  UNSPECIFIED VITAMIN D DEFICIENCY (ICD-268.9) Assessment: Improved continue and follow  Problem # 2:  B12 DEFICIENCY (ICD-266.2) Assessment: Unchanged check level.   Problem # 3:  PALPITATIONS, HX OF (ICD-V12.50) see prev eval. call if increasing again  Problem # 4:  HYPERTENSION (ICD-401.9) monitor  Complete Medication List: 1)  Cyanocobalamin 1000 Mcg/ml Soln (Cyanocobalamin) .... Administer 1 ml intramuscularly as directed 2)  Flexeril 10 Mg Tabs (Cyclobenzaprine hcl) .... Take as  directed 3)  Monoject Safety Syringe/shield 23g X 1" 3 Ml Misc (Syringe/needle (disp)) .... Use as directed 4)  Paroxetine Hcl 40 Mg Tabs (Paroxetine hcl) .... Take 1 tablet by mouth once a day 5)  Tgt Alcohol Swabs 70 % Pads (Alcohol swabs) .... Apply as directed 6)  Vitamin D 16109 Unit Caps (Ergocalciferol) .Marland Kitchen.. 1 by mouth once a week   Patient Instructions: 1)  continue high dose vit d  for 3 more months  2)  Then recheck vit d 268.9 3)  also lfts, tsh free t4    4)  then rov 5)  t    Prescriptions: VITAMIN D 60454 UNIT  CAPS (ERGOCALCIFEROL) 1 by mouth once a week  #12 x 1   Entered and Authorized by:   [name] MD   Signed by:   [name] MD on 09/05/2007   Method used:   Electronically sent to ...       [facility] [address] [facility]       [address]       [place], [place]  [address]       Ph: [phone]       Fax: [phone]   RxID:   [id number]  ]

## 2010-08-16 NOTE — Consult Note (Signed)
Summary: GYN notes  GYN notes   Imported By: Kassie Mends 10/29/2007 16:22:58  _____________________________________________________________________  External Attachment:    Type:   Image     Comment:   GYN notes

## 2010-08-16 NOTE — Assessment & Plan Note (Signed)
Summary: Pt n/s on 04/18/07  Pt n/s for a follow up appointment. ..................................................................Marland KitchenRomualdo Bolk, CMA  April 18, 2007 10:13 AM patient said she was sick with pneumonia and missed appt

## 2010-08-16 NOTE — Assessment & Plan Note (Signed)
Summary: sore throat/mhf   Vital Signs:  Patient Profile:   55 Years Old Female Weight:      190 pounds Temp:     98.5 degrees F oral Pulse rate:   78 / minute BP sitting:   130 / 80  (right arm) Cuff size:   regular  Vitals Entered By: Romualdo Bolk, CMA (June 03, 2007 10:01 AM)                 Chief Complaint:  Sore throat since 11/12, low grade temp at night, dry coughing, and congestion.  History of Present Illness: Melanie [name] come intoday with about 5 dys of above .sx Missed last appt because of pneumonia like syndrome rxed by Dr. Tawanna Cooler.  .sx resolved and now has new illness.  Has nasal congestion and now pressure in face continues.   No fever. No sig wheezing.  St is the most sig symptom.. no exposures . hurts to swallow and right ear clogged.  Current Allergies (reviewed today): ! PCN  Past Medical History:    Reviewed history from 02/04/2007 and no changes required:       Allergic rhinitis       GERD       Hypertension                 Family History:    Reviewed history from 02/03/2007 and no changes required:       Family History of Colon CA 1st degree relative  mom       Family History Hypertension       Family History of Stroke F 1st degree relative mom 35       Family History of Digestive disorder Fa esophageal ca  Social History:    Reviewed history from 02/03/2007 and no changes required:       Married       Former Smoker    Review of Systems       see hpi   Physical Exam  General:     Well-developed,well-nourished,in no acute distress; alert,appropriate and cooperative throughout examination congested Head:     Normocephalic and atraumatic without obvious abnormalities. No apparent alopecia or balding. Eyes:     no redness Ears:     R ear normal.  left with clear fluid Nose:     mucosal erythema and mucosal edema.   Mouth:     +2 red no exudate Neck:     No deformities, masses, or tenderness noted. Lungs:     Normal  respiratory effort, chest expands symmetrically. Lungs are clear to auscultation, no crackles or wheezes. Cervical Nodes:     tender ac nodes r more than left    Impression & Recommendations:  Problem # 1:  ACUTE PHARYNGITIS (ICD-462) possible early sinusitis symptom rex and if not immproving and localized symptom then call    may benefit from antibiotic  Orders: Rapid Strep (84132)  The following medications were removed from the medication list:    Biaxin 500 Mg Tabs (Clarithromycin) .Marland Kitchen... Take 1 tablet by mouth two times a day   Problem # 2:  DEFICIENCY, VITAMIN D NOS (ICD-268.9) needs follow up  Orders: Venipuncture (44010) TLB-Hepatic/Liver Function Pnl (80076-HEPATIC) TLB-Ferritin (82728-FER) T-Vitamin D (25-Hydroxy) (27253-66440)   Problem # 3:  ANEMIA (ICD-285.9) hx of and gast bypass surgery had been improved and needs .fu Her updated medication list for this problem includes:    Cyanocobalamin 1000 Mcg/ml Soln (Cyanocobalamin) .Marland Kitchen... Administer 1 ml intramuscularly  as directed  Orders: Venipuncture ([address]) TLB-Hepatic/Liver Function Pnl (80076-HEPATIC) TLB-CBC Platelet - w/Differential (85025-CBCD) TLB-Ferritin (82728-FER) Hgb: 13.9 (09/21/2006)   Hct: 41.1 (09/21/2006)   RDW: 12.5 (09/21/2006)   MCV: 90.2 (09/21/2006)   MCHC: 33.7 (09/21/2006) Ferritin: 27.7 (09/21/2006) Iron: 99 (09/21/2006)   % Sat: 29.8 (09/21/2006) B12: 841 (01/22/2007)   Folate: 11.5 (09/21/2006)   TSH: 1.72 (09/21/2006)   Problem # 4:  B12 DEFICIENCY (ICD-266.2) as above Orders: Venipuncture (60454) TLB-Hepatic/Liver Function Pnl (80076-HEPATIC) TLB-B12, Serum-Total ONLY (09811-B14) TLB-Ferritin (82728-FER)   Problem # 5:  ALKALINE PHOSPHATASE, ELEVATED (ICD-790.5) inc alk phos could be from vit d deficiency and will follow.   see nl ggt in the past  Complete Medication List: 1)  Cyanocobalamin 1000 Mcg/ml Soln (Cyanocobalamin) .... Administer 1 ml intramuscularly as  directed 2)  Flexeril 10 Mg Tabs (Cyclobenzaprine hcl) .... Take as directed 3)  Monoject Safety Syringe/shield 23g X 1" 3 Ml Misc (Syringe/needle (disp)) .... Use as directed 4)  Paroxetine Hcl 40 Mg Tabs (Paroxetine hcl) .... Take 1 tablet by mouth once a day 5)  Tgt Alcohol Swabs 70 % Pads (Alcohol swabs) .... Apply as directed 6)  Vitamin D 78295 Unit Caps (Ergocalciferol) .Marland Kitchen.. 1 by mouth once a week   Patient Instructions: 1)  await labs and plan follow up 2)  sudafed symptom rx and call if localizing symptom continue and consider antibiotic for sinusitis as appropriate.    ] Laboratory Results    Other Tests  Rapid Strep: negative Comments ...................................................................[name]  June 03, 2007 11:15 AM      Chief Complaint:  Sore throat since 11/12, low grade temp at night, dry coughing, and congestion.  History of Present Illness: Melanie [name] come intoday with about 5 dys of above .sx Missed last appt because of pneumonia like syndrome rxed by Dr. Tawanna Cooler.  .sx resolved and now has new illness.  Has nasal congestion and now pressure in face continues.   No fever. No sig wheezing.  St is the most sig symptom.. no exposures . hurts to swallow and right ear clogged.   Patient Instructions: 1)  await labs and plan follow up 2)  sudafed symptom rx and call if localizing symptom continue and consider antibiotic for sinusitis as appropriate.

## 2010-08-16 NOTE — Progress Notes (Signed)
Summary: refill paxil  Phone Note From Pharmacy   Caller: [facility] Reason for Call: Needs renewal Details for Reason: Paxil 20mg   Summary of Call: 1 by mouth three times a day #90. Initial call taken by: Romualdo Bolk, CMA (AAMA),  May 17, 2010 3:00 PM  Follow-up for Phone Call        Rx sent to pharmacy. Pt never made an appt. I called the pharmacy and told them to have pt contact us before next refill. Follow-up by: Romualdo Bolk, CMA (AAMA),  May 17, 2010 3:10 PM    Prescriptions: PAROXETINE HCL 20 MG  TABS (PAROXETINE HCL) 3 by mouth daily  #90 x 0   Entered by:   Romualdo Bolk, CMA (AAMA)   Authorized by:   [name] MD   Signed by:   Romualdo Bolk, CMA (AAMA) on 05/17/2010   Method used:   Electronically to        [facility]  [phone]* (retail)       [address]       [place], [place]  [address]       Ph: [phone] or [phone]       Fax: [phone]   RxID:   [id number]

## 2010-08-16 NOTE — Progress Notes (Signed)
Summary: faxed a ekg to [facility]  Phone Note Other Incoming   Call placed by: denise from [facility] Call placed to: medical records Action Taken: Phone Call Completed Summary of Call: need a ekg on Melanie [name] faxed to [facility] Initial call taken by: Drue Stager,  January 07, 2008 5:37 PM  Follow-up for Phone Call        Phone call completed Follow-up by: Drue Stager,  January 07, 2008 5:37 PM

## 2010-08-16 NOTE — Assessment & Plan Note (Signed)
Summary: acute fibromylgia pain/dm   Vital Signs:  Patient Profile:   55 Years Old Female Weight:      189 pounds Pulse rate:   78 / minute BP sitting:   150 / 90  (left arm) Cuff size:   regular  Vitals Entered By: Romualdo Bolk, CMA (October 03, 2007 11:19 AM)                 Chief Complaint:  Fibromylgia.  History of Present Illness: Melanie [name] is here for fibromylgia. See phone note  Pt states that it is in her hands,wrist, shoulders, neck and knees. Pt is also having a headache. Pt states the left side is worse that the right. Pt also states this is going on for 3 days. Pt reviewed medications and changes made. thinks its the fibro because of the area of tenderness .     No fever,cough, nause or vomiting.   Sleep not well because of pain.   took daughters pain pill 10/650  slept slightly better.  left shoulder problematic. Hard to use   soemtimes pain in arms    Dr Arelia Sneddon  did pap.  and to have [place] for ? adnexal fullness.  No shoulder injury and is right handed .Marland Kitchen r wrist probematic and using st x 2-3 days.  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    Prior Medications Reviewed Using: Patient Recall  Prior Medication List:  CYANOCOBALAMIN 1000 MCG/ML SOLN (CYANOCOBALAMIN) Administer 1 ml intramuscularly as directed FLEXERIL 10 MG TABS (CYCLOBENZAPRINE HCL) Take as directed MONOJECT SAFETY SYRINGE/SHIELD 23G X 1" 3 ML MISC (SYRINGE/NEEDLE (DISP)) use as directed PAROXETINE HCL 40 MG TABS (PAROXETINE HCL) Take 1 tablet by mouth once a day TGT ALCOHOL SWABS 70 % PADS (ALCOHOL SWABS) Apply as directed VITAMIN D 54098 UNIT  CAPS (ERGOCALCIFEROL) 1 by mouth once a week   Current Allergies (reviewed today): ! 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     Physical Exam  General:     Well-developed,well-nourished,in no acute distress; alert,appropriate and cooperative throughout examinationsplint on right hand Head:     Normocephalic and atraumatic without obvious abnormalities. No apparent alopecia or balding. Neck:     No deformities, masses, or tenderness noted.no thyromegaly.   Lungs:     Normal respiratory effort, chest expands symmetrically. Lungs are clear to auscultation, no crackles or wheezes. Heart:     normal rate, regular rhythm, no murmur, and no rub.   Msk:     r wristr in splint   no acute swelling  right shoulder has limited rom,  elevation secondary to pain at anterior shoulder  no crepitus. decrease internal rotation   some point tenderness back of neck  Pulses:     present upper extremities Extremities:     no cce  Skin:     turgor normal  and color normal.   Cervical Nodes:     no anterior cervical adenopathy and no posterior cervical adenopathy.   Psych:     memory intact for recent and remote, normally interactive, and good eye contact.      Impression & Recommendations:  Problem # 1:  SHOULDER PAIN, LEFT (ICD-719.41) sounds like rotator cuff ? if impingement  separate from FM  . rec she see Dr Renae Fickle or colleague  .  Pt to make appt. Her updated medication list for this problem includes:    Flexeril 10 Mg Tabs (Cyclobenzaprine hcl) .Marland Kitchen... Take as directed   Problem # 2:  FIBROMYALGIA (ICD-729.1) hx same ? flare because of poor sleep Treatment options discussed.   will try lyrica 50 hs and then increase as tolerated    Her updated medication list for this problem includes:    Flexeril 10 Mg Tabs (Cyclobenzaprine hcl) .Marland Kitchen... Take as directed   Complete Medication List: 1)  Cyanocobalamin 1000 Mcg/ml Soln  (Cyanocobalamin) .... Administer 1 ml intramuscularly as directed 2)  Flexeril 10 Mg Tabs (Cyclobenzaprine hcl) .... Take as directed 3)  Monoject Safety Syringe/shield 23g X 1" 3 Ml Misc (Syringe/needle (disp)) .... Use as directed 4)  Paroxetine Hcl 40 Mg Tabs (Paroxetine hcl) .... Take 1 tablet by mouth once a day 5)  Tgt Alcohol Swabs 70 % Pads (Alcohol swabs) .... Apply as directed 6)  Vitamin D 07371 Unit Caps (Ergocalciferol) .Marland Kitchen.. 1 by mouth once a week 7)  Lyrica 50 Mg Caps (Pregabalin) .Marland Kitchen.. 1 by mouth hs and then increase to two times a day and then three times a day for pain   Patient Instructions: 1)  See your orthopedist about your shoulder pain ? tendinitis < arthritis  2)  Try Lyrica 50 mg hs and can try to increase to taking 1 two to 3 x per day but may cause drowsiness also  .   3)  Try tylenol hs  as needed.  ( advil may work better for shoulder) 4)  Call in 2 weeks about medication effect .     ]

## 2010-08-18 NOTE — Letter (Signed)
Summary: Generic Letter   at [facility]  [address] [place], [place] [address]   Phone: [phone]  Fax: [phone]    08/10/2010  Matie Sheppard [address] [place], [place]  [address]  Dear Ms. Pfenning,  We have been trying to contact you to schedule a follow up appt. In order to make sure that your are getting the correct medical care, you are due for a follow up appointment on your medication before your next refill. Please call our office at [phone] to schedule this appointment.         Sincerely,   Tor Netters, CMA (AAMA)

## 2010-10-24 LAB — CBC
HCT: 40.2 % (ref 36.0–46.0)
Hemoglobin: 13.5 g/dL (ref 12.0–15.0)
MCHC: 33.7 g/dL (ref 30.0–36.0)
MCV: 88.3 fL (ref 78.0–100.0)
Platelets: 224 10*3/uL (ref 150–400)
RBC: 4.55 MIL/uL (ref 3.87–5.11)
RDW: 12.8 % (ref 11.5–15.5)
WBC: 7 10*3/uL (ref 4.0–10.5)

## 2010-10-24 LAB — BASIC METABOLIC PANEL
BUN: 12 mg/dL (ref 6–23)
CO2: 25 mEq/L (ref 19–32)
Calcium: 8.8 mg/dL (ref 8.4–10.5)
Chloride: 110 mEq/L (ref 96–112)
Creatinine, Ser: 0.55 mg/dL (ref 0.4–1.2)
GFR calc Af Amer: >60 mL/min (ref >60)
GFR calc non Af Amer: >60 mL/min (ref >60)
Glucose, Bld: 79 mg/dL (ref 70–99)
Potassium: 4.1 mEq/L (ref 3.5–5.1)
Sodium: 141 mEq/L (ref 135–145)

## 2010-11-29 NOTE — Assessment & Plan Note (Signed)
[facility]                                 ON-CALL NOTE   NAME:Melanie [name], Melanie [name]                          MRN:          [id number]  DATE:04/14/2007                            DOB:          1955/10/30    TIME RECEIVED:  Is 9:34 a.m.   CALLER:  Halee Glynn.   She sees Dr. Fabian Sharp.   TELEPHONE:  [phone].   The patient has been sick for over a week with fever and cough.  She saw  Dr. Tawanna Cooler this past Thursday and was diagnosed with pneumonia.  She was  given a course of Biaxin as well as a cough medication.  Her symptoms  remained the same, but now for the last 24 hours she also has a sore  throat and blisters on the inside of her mouth making it difficult to  eat or swallow.  She has no other rashes on her body.  She is drinking  plenty of fluids.   She is allergic to PENICILLIN.   My response is that this does not sound like an allergic reaction to the  medications but could possibly be a sign of a viral infection such as  hand/foot and mouth disease.  I did advise her to continue the  antibiotics as prescribed.  However, we will call in magic mouthwash to  use 5 mL every 4 hours as needed to [facility] at [phone].  She can follow  up at the office tomorrow as needed.     Tera Mater. Clent Ridges, MD  Electronically Signed    SAF/MedQ  DD: 04/14/2007  DT: 04/14/2007  Job #: [id number]

## 2010-11-29 NOTE — Op Note (Signed)
Melanie [name], Melanie [name]                 ACCOUNT NO.:  [id number]   MEDICAL RECORD NO.:  [id number]          PATIENT TYPE:  OBV   LOCATION:  9305                          FACILITY:  [facility]   PHYSICIAN:  Juluis Mire, M.D.   DATE OF BIRTH:  11-Oct-1955   DATE OF PROCEDURE:  11/19/2007  DATE OF DISCHARGE:                               OPERATIVE REPORT   PREOPERATIVE DIAGNOSIS:  Cystic pelvic mass.   POSTOPERATIVE DIAGNOSES:  1. Cystic neoplasm of the right ovary.  2. Pelvic abdominal adhesions.   OPERATIVE PROCEDURES:  1. Open laparoscopy.  2. Lysis of adhesions.  3. Bilateral salpingo-oophorectomy.  4. Cystoscopy.   SURGEON:  Juluis Mire, M.D.   ANESTHESIA:  General endotracheal.   ESTIMATED BLOOD LOSS:  200-300 mL.   PACKS AND DRAINS:  None.   INTRAOCULAR BLOOD PLACEMENT:  None.   COMPLICATIONS:  None.   INDICATIONS:  Dictated in history and physical.   PROCEDURE:  As follows:  The patient was taken to the OR, placed in  supine position.  After satisfactory level of general endotracheal  anesthesia was obtained, the patient was placed in the dorsal lithotomy  position using the Allen stirrups.  PAS stockings were in place.  At  this point in time, the patient's abdomen, perineum, and vagina were  prepped with Betadine.  Bladder was emptied by in-and-out  catheterization.  Sponge and a sponge stick was placed in the vaginal  vault.  The patient was then draped in a sterile field.  Subumbilical  incision was made with a knife, carried through subcutaneous tissue.  The fascia was identified, entered sharply, and the incision attached  laterally.  The muscles were separated.  Peritoneum was identified,  elevated with Kelly's, and entered sharply.  Palpation revealed no  periumbilical adhesions.  The open laparoscopic trocar was put in place  and secured.  The abdomen was then inflated with carbon dioxide.  Laparoscope was introduced.  There was no evidence of injury to  adjacent  organs.  A 5-mm trocar was put in place in the suprapubic area.  A 5-mm  trocar was put in place in the left lower quadrant after visualization  of epigastric vessels.  Visualization revealed cystic enlargement of the  right ovary about 7 cm.  There was no excrescences, no ascites, and no  evidence of any metastases.  It look like a benign simple cyst.  We did  obtain pelvic washings and they were sent off.  At this point in time,  she had some omental adhesions down to the pelvic cavity.  These were  taken down using the gyrus with a cautery incision with good retraction.  At this point in time, we were able to identify again a cystic  enlargement of right ovary.  We were able to elevate the ovary.  We  could identify the ovarian vasculature.  The ureter was easily seen in  the pelvic sidewall coursing down through the pelvis.  We isolated the  ovarian vasculature above the ureter.  Using the gyrus, we were able to  cauterize and incise  the ovarian vasculature.  We continued cautery  incision to detach the ovary from the mesenteric attachments up to the  round ligament.  The round ligament was then cauterized and incised.  We  did enter the cyst at this point.  Fluid was obtained, it looked  somewhat mucinous.  We thoroughly irrigated the pelvis.  We were able to  elevate the ovary and finish dissecting it free from the pelvic  sidewall.  At this point in time, we took out the suprapubic 5-mm trocar  and placed it with a 10/11.  We inserted an Endobag.  We were able to  insert the right tube and ovary in the Endobag and removed it through  the suprapubic incision.  We closely evaluated.  It was a smooth-walled  cyst.  There was no evidence of any solid areas or excrescences, and it  was sent for pathology.  The 10/11 was put back in place.  We thoroughly  irrigated the right pelvic sidewall and had good hemostasis.  We then  went to the left ovary.  Left ovary was elevated.   Again, the ureter was  easily seen coursing through the pelvic cavity.  We isolated the ovarian  vasculature above the ureter.  Using the gyrus, we were cauterized and  incised the ovarian vasculature.  We continued the cautery incision  detaching the ovary from the mesenteric attachments.  At this point in  time, the Endobag was reinserted through the suprapubic 10/11.  The left  tube and ovary was inserted in the Endobag and it was removed through  the suprapubic incision and sent to pathology.  The 10/11 then was put  in place, we irrigated the pelvis.  We had some oozing from the left  pelvic sidewall, where the ovary had been removed.  This was brought out  and controlled with the gyrus.  At this point, we had good hemostasis.  The decision was to proceed with cystoscopy.   The patient's legs were repositioned.  The patient had been given indigo  carmine.  Cystoscope was then introduced.  The bladder was distended  using irrigation.  There was no evidence of any bladder injury.  Both  ureteral orifices were easily visualized, noted to be spilling blue-  tinged urine.  The cystoscope was then removed and the bladder was  drained.   Laparoscope was reintroduced.  Visualization revealed good hemostasis at  the side of both ovarian removal.  We irrigated the pelvis again,  removed irrigation.  No active bleeding was noted.  The abdomen was  deflated with carbon dioxide.  All trocars removed.  Subumbilical fascia  closed with two figure-of-eights of 0 Vicryl.  Skin with interrupted  subcuticular suture of 4-0 Vicryl.  Suprapubic incisions, the midline  was closed with interrupted subcuticular suture of 4-0 Vicryl and  Dermabond.  The one in the left lower quadrant was closed with Dermabond.   The patient was taken out of the dorsal lithotomy position, was alert  and extubated, and transferred to recovery room in good condition.  Sponge, instrument, and needle count was correct by  circulating nurse  x2.      Juluis Mire, M.D.  Electronically Signed     JSM/MEDQ  D:  11/19/2007  T:  11/20/2007  Job:  [id number]
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
## 2010-11-29 NOTE — Op Note (Signed)
NAME:  Melanie [name], Melanie [name]                 ACCOUNT NO.:  [id number]   MEDICAL RECORD NO.:  [id number]          PATIENT TYPE:  AMB   LOCATION:  NESC                         FACILITY:  [facility]   PHYSICIAN:  Deidre Ala, M.D.    DATE OF BIRTH:  1956/01/15   DATE OF PROCEDURE:  01/09/2008  DATE OF DISCHARGE:                               OPERATIVE REPORT   PREOPERATIVE DIAGNOSES:  1. Left shoulder impingement with type III-IV acromion.  2. AC (acromioclavicular) joint arthritis.  3. Positive MRI for biceps tendinitis and supraspinatus tendinitis.   POSTOPERATIVE DIAGNOSES:  1. Left shoulder impingement with type III-IV acromion.  2. Severe AC (acromioclavicular) joint arthritis.  3. Glenohumeral DJD (degenerative joint disease) grade III-IV.  4. Biceps synovitis without SLAP (superior labrum anterior posterior)      detachment or tendinitis of intra-articular portion of biceps.  5. Thick subdeltoid bursitis with partial thickness rotator cuff      tearing.   PROCEDURE:  1. Left shoulder operative arthroscopy with subacromial arch      decompression acromioplasty.1.  2. Extensive debridement of subdeltoid bursa, extensive glenohumeral      DJD grade III-IV and SLAP degenerative tearing without detachment      and peri tenosynovitis at SLAP area.   SURGEON:  1. Charlesetta Shanks, M.D.   ASSISTANT:  Phineas Semen, P.A.   ANESTHESIA:  General with endotracheal with scalene block.   CULTURES:  None.   DRAINS:  None.   ESTIMATED BLOOD LOSS:  Minimal.   PATHOLOGIC FINDINGS AND HISTORY:  Melanie [name] first presented to [place] in  March of 2009 with shoulder pain.  She had a type III-IV acromion.  She  had impingement signs with evidence of AC joint arthritis.  She was  given cortisone injections AC joint and subacromial.  She came back in  October 28, 2007.  The shoulder is doing fine.  She came back in Nov 18, 2007.  She was doing well.  She came back in December 30, 2007.  The  shoulder was bothering  her again.  She was losing her insurance and  because of her discomfort she desired to proceed with surgical  intervention because of increasing pain.  An MRI was obtained showing a  partial thickness width articular surface tearing in the distal  supraspinatus near the insertion with bursal surface fraying of the  tendon.  There was subacromial bursitis.  There was severe subscapularis  tendinopathy at its insertion.  There was marked long head biceps  tendinopathy at the rotator interval raising the possibility of a biceps  pulley lesion and there was a type I acromion with AC joint  osteoarthritis with superior subscapularis recess synovitis.  At surgery  she had glenohumeral DJD grade III-IV over about a quarter sized area on  both the glenoid and humeral head side in the center of the joint.  She  had marked slap fraying but no detachment.  There was a lot of synovitis  around the biceps attachment but the biceps tendon itself when pulled  into the joint was not particularly fibrillated  or reddened and with its  anchor intact I chose not to do a tenotomy.  The humeral head surface,  undersurface of the rotator cuff had some tearing which we debrided but  it was not through-and-through.  The subacromial spur was sharp and  hooked.  The [facility] joint was extremely arthritic.  This was all debrided  back to [facility] margins.  The subdeltoid bursa was intensely thick.  It  was debrided down to the critical zone where there was fraying but the  rotator cuff, despite a rather large rotator cuff interval, was  otherwise intact to the tuberosity and was therefore lightly debrided  superficially and the ablator on 1 was used to smooth.  Good SCD/DCR was  obtained from the side.   PROCEDURE IN DETAIL:  With adequate anesthesia obtained using  endotracheal technique and scalene nerve block the patient was placed in  a supine beach chair position.  The left shoulder was prepped and draped  in the  standard fashion.  After standard prepping and draping, skin  markings were made for anatomic positioning.  Twenty mL of 0.5% Marcaine  with epinephrine was injected in the subacromial space to open it up.  I  then entered the shoulder through a posterior portal, anterior portal  was established just lateral to the coracoid.  Through the anterior  portal I probed the labral area, brought in the shaver, debrided it and  then smoothed with the ablator on 1.  I then pulled the biceps into the  shoulder to make sure it was not excessively tendinitic, extra-articular  or for that matter intra-articular.  I then shaved up underneath the  rotator cuff at its insertion and smoothed with the ablator.  Portals  reversed and similar shavings carried out.  I then entered the shoulder  from the subacromial space from the posterior portal into the  subacromial space.  Anterolateral portal was established.  I then  brought in the shaver and removed bursa then shaved the anterior  undersurface of the acromion anteriorly of its soft tissue, cauterized  with the ablator.  I then released the CA ligament and brought in a 6.0  bur and completed acromioplasty to the roof of the subacromial space in  the manner of Caspari.  I then turned the scope medially sideways and  through an anterior portal debrided AC meniscus with shaver and baskets.  I then completed distal clavicle resection two shaver breadths in.  I  then visualized the shoulder from the anterolateral portal, completed  acromioplasty back to the bicortical bone in the manner of Caspari.  The  scope was then turned downward.  I debrided intense bursitis with  internal/external rotation, neutral and abduction and around the rotator  cuff interval.  I then brought in the ablator and smoothed the cuff on  1.  When I was satisfied with the resection the shoulder was irrigated  through the scope.  No additional Marcaine was used.  The portals were  closed  with 4-0 nylon.  A bulky sterile compressive dressing was applied  with sling and the patient having tolerated the procedure well was  awakened and taken to the recovery room in satisfactory condition to be  discharged per outpatient routine, given Percocet for pain and told to  call the office for recheck tomorrow for dressing change.           ______________________________  V. Charlesetta Shanks, M.D.     VEP/MEDQ  D:  01/09/2008  T:  01/09/2008  Job:  [id number]   cc:   Neta Mends. Fabian Sharp, MD  [address] [place], [place] [address]

## 2010-11-29 NOTE — H&P (Signed)
NAME:  Melanie [name], Melanie [name] NO.:  [id number]   MEDICAL RECORD NO.:  [id number]          PATIENT TYPE:  OBV   LOCATION:  9305                          FACILITY:  [facility]   PHYSICIAN:  Juluis Mire, M.D.   DATE OF BIRTH:  1956-06-29   DATE OF ADMISSION:  11/19/2007  DATE OF DISCHARGE:                              HISTORY & PHYSICAL   The patient is a 55 year old gravida 2, para 2 female, who presents for  laparoscopic evaluation with possible bilateral salpingo-oophorectomy.   RELATION TO PRESENT ADMISSION:  The patient had a previous total vaginal  hysterectomy in 1996.  She was seen in the office for routine exam on  2009.  A pelvic fullness was noted at that time.  Subsequent ultrasound  revealed 7.6 cm simple cyst within the midline of the pelvis extended to  the right side.  It was felt by ultrasound, this could be a paraovarian  or paratubular cyst or a hydrosalpinx, but an ovarian process could not  be ruled out.  It did persist on follow-up ultrasound.  Her CA-125 was  normal with a value of 2.9.  After discussion of options, the patient  now presents for laparoscopic evaluation.  She wishes to have both  ovaries removed at this point in time.   ALLERGIES:  In terms of allergies, she is allergic to PENICILLIN.   MEDICATIONS:  She is on Paxil, Flexeril, vitamin supplements.  She is  now on hydrochlorothiazide for management of hypertension.   PAST MEDICAL HISTORY:  She has recently been started to be treated for  hypertension by Dr. Fabian Sharp.  She is on medications as noted.  Otherwise,  usual childhood diseases.  No significant sequelae.  Does have a history  of fibromyalgia.   PREVIOUS OPERATION:  She has had two cesarean sections, one in 1981 and  in 1984.  She had her gallbladder removed in the past, has had knee  surgery in 2004, gastric bypass in 2006, vaginal hysterectomy in 1996.  She had benign tumor removed from the leg in 1990.  Had cyst removed  from  the wrist in 1991.   FAMILY HISTORY:  Mother with a history of colon cancer as well as  hypertension.   SOCIAL HISTORY:  No tobacco, no occasional alcohol use.   REVIEW OF SYSTEMS:  Noncontributory.   PHYSICAL EXAMINATION:  The patient is afebrile, stable vital signs.  HEENT:  Head is normocephalic.  Pupils are equal, round, and reactive to  light and accommodation.  Extraocular movements are intact.  Sclerae and  conjunctivae are clear.  Oropharynx is clear.  NECK:  Without thyromegaly.  BREASTS:  No discrete masses.  LUNGS:  Clear.  CARDIAC SYSTEM:  Regular and rate.  No murmurs or gallops.  No carotid  bruits.  ABDOMEN:  Benign.  No mass, organomegaly, or tenderness.  PELVIC:  Normal external genitalia.  Vaginal mucosa is clear.  Cuff  intact.  She has a right-sided pelvic fullness extending to the midline.  Adnexa otherwise unremarkable.  EXTREMITIES:  Trace edema.  NEUROLOGIC:  Grossly within normal limits.  IMPRESSION:  1. Cystic pelvic mass.  2. Right ovarian neoplasm.   PLAN:  At the present time, we are going to proceed with laparoscopic  evaluation and attempted evaluation for removal.  She does understand  this is a malignant or premalignant ovarian changes that further staging  procedures may need to be undertaken.  We are going to attempt the  laparoscopic removal of the cyst and both ovaries.  Potential for  exploratory surgery explained.  The risk of surgery discussed including  the risk of infection.  The risk of hemorrhage that could require  transfusion with the risk of AIDS or hepatitis.  The risk of injury to  adjacent organs including bladder, bowel, ureters that could require  further exploratory surgery.  The risk of deep venous thrombosis and  pulmonary emboli.  The patient does understand indications and risks.      Juluis Mire, M.D.  Electronically Signed     JSM/MEDQ  D:  11/19/2007  T:  11/19/2007  Job:  [id number]

## 2010-12-02 NOTE — Op Note (Signed)
Melanie [name], Melanie [name]                             ACCOUNT NO.:  [id number]   MEDICAL RECORD NO.:  [id number]                   PATIENT TYPE:  INP   LOCATION:  2550                                 FACILITY:  [facility]   PHYSICIAN:  Deidre Ala, M.D.                 DATE OF BIRTH:  17-Oct-1955   DATE OF PROCEDURE:  02/19/2003  DATE OF DISCHARGE:                                 OPERATIVE REPORT   PREOPERATIVE DIAGNOSIS:  End stage degenerative joint disease, left knee.   POSTOPERATIVE DIAGNOSIS:  End stage degenerative joint disease, left knee.   OPERATION PERFORMED:  Left total knee arthroplasty using cemented DePuy  components with rotating platform LCS type.   SURGEON:  Bradley Ferris, M.D.   ASSISTANT:  Madilyn Fireman, P.A.-C.   ANESTHESIA:  General endotracheal.   CULTURES:  None.   DRAINS:  Two medium Hemovacs to Autovac.   ESTIMATED BLOOD LOSS:  .   BLOOD REPLACED:  Without.   TOURNIQUET TIME:  One hour 32 minutes.   PATHOLOGIC FINDINGS AND HISTORY:  Melanie [name] had a knee arthroscopy by Dr. Turner Daniels  April 18, 2000.  She then did well until she worked for [employer],  stepped on a plastic thermometer probe cover where she worked, twisted the  knee, was seen by me June 20, 2002 and that was two days after the event.  I thought she had torn her lateral meniscus.  I took her to the operating  room September 02, 2002.  I did a knee arthroscopy where we found eburnated  bone in the posterior medial compartment, grade 3 changes laterally in the  lateral tibial plateau and posterior patella with a large amount of scar  plica and grade 3 to 4 changes on the posterior patellar surface.  We  debrided her postoperatively.  She initially did well but continued to have  pain.  We tried to do Hyalgan but she was never approved for this.  This was  worker's comp.  Ultimately, x-rays were taken December 19, 2002 which showed bone  on bone with significant spurring on the medial plateau,  medial condyle and  it was felt that she was end stage.  We therefore sought approval for a  total knee arthroplasty and was granted same so we proceeded with surgery.  At surgery, she had significant tricompartment disease, bone-on-bone.  This  was an arduous case because of her morbid obesity but we were able to  accomplish a well fitting knee with good ligament balance in flexion  extension, a standard left femur, a size 3 tibia, a standard 17.5 mm insert  and a 35 mm patella.  We used two batches of cement with two doses of  tobramycin.  We had full extension with flexion to 95 degrees.  She had  ligament stability throughout.   DESCRIPTION OF PROCEDURE:  With adequate anesthesia obtained using  endotracheal technique, 300 mg of Cleocin given IV prophylaxis and another  one at tourniquet letdown, the patient was placed in supine position. The  left lower extremity was prepped from the toes to the tourniquet in standard  fashion.  After standard prepping and draping, Esmarch exsanguination was  used.  The tourniquet was let up to .  A median parapatellar skin  incision was followed by median parapatellar retinacular incision.  Incision  was deepened sharply with a knife and hemostasis obtained using a Engineer, drilling.  The patella was then everted.  The fat pad excised.  Both menisci and cruciates excised.  Arduous retraction was undertaken but  we were able to then saw off the anterior spine and place the extramedullary  guide in slight tibial prosthesis varus for overall valgus alignment of the  component, total component and the tibia relative to the prosthesis.  This  was very slight but about the way I usually like to do it.  We then made our  initial cut.  I felt it would be too tight, so I cut five more.  We then  sized her to a standard femur, placed the jig in place, made the anterior  posterior cut but I was very shy superiorly, and I was a little loose   inferiorly, so we actually did not make the posterior cut at first, we just  made the anterior, so we moved the jig down 5 mm.  We did not significantly  notch the anterior femur maybe a millimeter and then the posterior cuts were  5 mm less, so that we fit in flexion to a 17.5.  We set the guide with the  17.5, pinned it, we made the cuts, then we fit the 17.5 insert.  We then  placed a 4 degree valgus cutting jig in place with the intramedullary guide  and cut the femur.  We felt it to be a bit tight for the 17.5 so I made an  additional cut from the first 2.5 mm more and then we fit in full extension  with a 17.5 and a 17.5 flexion gap with ligament balance.  I then put the  anterior-posterior, Chamfer and notch cutting jig in place and made those  cuts. There was no need to do the far posterior cut.  We then exposed the  proximal tibia and sized to a 3, pinned it and made our peg hole as well as  our broach for the keel.  We then placed a 17.5 trial insert on the tibia,  placed the femoral component in place and articulated the knee through a  range of motion with appropriate fit and fill.  I then sized the patella  with a caliper at a 19.5, cut down to a 13 with the guide, placed a three-  peg guide in place, made those holes and put the trial 35 mm patella in  place and it tracked well.  All trial components were then removed while  cement was mixed and components were checked for size as they came on the  field.  We also thoroughly jet lavaged the knee at this point with the  irrigating system.  We then exposed the proximal tibia and cemented with a  cement gun the tibial component, impacted it and removed excess cement.  We  then placed the 17.5 rotating platform, then cemented on the femoral  component, impacted it, removed excess cement, held it in full extension. I  removed excess  cement and then held it in 45 degrees until the cement had cured. We then placed the patellar  component in place during this and  impacted it on and held it with a clamp and removed excess cement and held  it until the cement had cured.  When the cement had hardened, the tourniquet  was let down.  Bleeding points were cauterized.  The knee was then further  irrigated with a jet lavage.  We then closed the wound in layers, using  Hemovac drains in the medial, lateral gutter and brought out the  superolateral portal.  The wound was then closed with #1 Vicryl in the  retinaculum, 0 and 2-0 Vicryl on the subcu and skin staples.  A bulky  sterile compressive dressing was applied with knee immobilizer.  Femoral  nerve block was to be placed and the patient then was taken to the recovery  room in satisfactory condition for routine postoperative care, CPM and  femoral nerve block and other analgesia and rehabilitation.                                                Deidre Ala, M.D.    VEP/MEDQ  D:  02/19/2003  T:  02/19/2003  Job:  [id number]

## 2010-12-02 NOTE — Op Note (Signed)
[facility]  Patient:    Melanie [name], Melanie [name]                          MRN: [id number] Proc. Date: 04/18/00 Adm. Date:  [id number] Attending:  Alinda Deem                           Operative Report  PREOPERATIVE DIAGNOSIS: Right knee medial meniscal tear, locked.  POSTOPERATIVE DIAGNOSIS:  1. Parrot-beak tear of medial meniscus of right knee.  2. Degenerative tearing of the lateral meniscus of the right knee.  3. Mild chondromalacia of the medial femoral condyle.  OPERATION/PROCEDURE: Right knee partial arthroscopic medial and lateral meniscectomies and debridement of chondromalacia from the medial femoral condyle, grade 2-3 local, and apex of the patella, grade 3.  SURGEON: Alinda Deem, M.D.  ASSISTANT: Skip Mayer, P.A.-C.  ANESTHESIA: Local, followed by general LMA.  ESTIMATED BLOOD LOSS: Minimal.  FLUID REPLACEMENT: Fluid replacement was 600 cc crystalloid.  DRAINS: None.  TOURNIQUET TIME: None.  INDICATIONS FOR PROCEDURE: The patient is a 55 year old woman with increasing medial joint line pain of the right knee and inability to full extend the knee or flex the knee, consistent with a medial meniscal tear.  This has gotten worse over the last four weeks and she now desires arthroscopic evaluation and treatment of same.  Plain radiographs were pretty much nondiagnostic but clinically she has a medial meniscal tear.  She has trouble working and has trouble doing things around the house, and is tired of hurting.  DESCRIPTION OF PROCEDURE: The patient was identified by armband was by taken to the operating room of the [facility] day surgery center and appropriate anesthetic monitors were attached.  Local anesthesia with IV sedation was induced.  Unfortunately, she still could not fully extend her locked knee and had pain.  We then induced general LMA anesthesia.  A lateral post was applied to the table and the right  lower extremity was prepped and draped in the usual sterile fashion from the ankle to the mid thigh.  Using a #11 blade standard inferior medial and inferolateral peripatellar portals were then made, allowing introduction of the arthroscope through the inferolateral portal and the outflow through the anterior medial portal.  Grade 2-3 focal chondromalacia of the patella was identified and debrided.  Grade 2 chondromalacia of the medial femoral condyle was identified and debrided as well as a parrot-beak incarcerated tear of the medial meniscus, which was photographed and then debrided with the 3.5 Gator sucker shaver as well as the small basket biters.  The ACL and the PCL were intact on the lateral side and she had a degenerative tearing of the posterior horn of the laterally meniscus, which was debrided back to a stable margin, and the scope was taken medial and lateral to the PCL as well as to the gutters, clearing any loose bodies.  The knee was then washed out with normal saline solution.  The arthroscopic instruments were removed and dressing of Xeroform, 4 x 4s, dressing sponges, Webril, and Ace wrap applied.  The patient was then awakened and taken to the recovery room without difficulty. DD:  04/18/00 TD:  04/19/00 Job: [id number]

## 2010-12-02 NOTE — Discharge Summary (Signed)
Melanie [name], Melanie [name]                             ACCOUNT NO.:  [id number]   MEDICAL RECORD NO.:  [id number]                   PATIENT TYPE:  INP   LOCATION:  5039                                 FACILITY:  [facility]   PHYSICIAN:  Deidre Ala, M.D.                 DATE OF BIRTH:  03/19/1956   DATE OF ADMISSION:  02/19/2003  DATE OF DISCHARGE:  02/22/2003                                 DISCHARGE SUMMARY   ADMISSION DIAGNOSIS:  Left knee end-stage degenerative joint disease.   DISCHARGE DIAGNOSIS:  Left knee end-stage degenerative joint disease, status  post left total knee arthroplasty.   SUMMARY:  The patient was brought to the operating room at [facility] on February 19, 2003 for a left total knee arthroplasty.  This was  done under general anesthesia with a femoral nerve block with a tourniquet  time of 1 hour, 32 minutes, and estimated blood loss of 150 cc.  The wound  was closed over 2 medium Hemovac drains, hooked up to an Autovac.  She was  taken to the Recovery Room in stable condition, admitted with PCA for pain  control.   On February 20, 2003, postoperative day 1, she was complaining of moderate left  knee pain.  Her PCA had been stopped due to decreased blood pressure, and  she was taking no current pain medications at that morning.  She had had no  nausea complaints.  Vitals are stable.  T-max at 99.3.  Left knee dressing  was clean and dry.  Calf was soft and nontender.  She was neurovascularly  intact.  Postoperative labs showed hemoglobin 12.3, hematocrit 36.5.  Serum  chemistries normal, except for elevated glucose of 131 and a low calcium of  7.9.  PT was 13.9 with an INR of 1.1.  PT, OT, and Rehab were consulted.  She was on Coumadin and Lovenox per Pharmacy protocol.  She was out of bed  to chair, ambulate as tolerated, weightbearing as tolerated left knee, with  the immobilizer on, using the walker for ambulation.  Her PCA was  discontinued.  Mepergan Fortis  1-2 p.o. q.3-4h. p.r.n. for pain, Dilaudid 1-  3 mg IV q.4h. p.r.n. severe breakthrough pain.  Dressing was to be changed  and the drain discontinued.  The IV was dropped down to a saline well the  next day and were anticipating discontinuing the IV Sunday and discharging  home on Monday.   On February 21, 2003, she was without complaints.  T-max was 100.0.  Hemovac  was discontinued without difficulty.  The dressing was changed.  The wound  was benign.  The calf was soft and nontender, neurovascularly intact.  PT  was 14.0 with an INR of 1.1.  Hemoglobin 11.  Hematocrit 32.0.   On postoperative day 3, February 22, 2003, she was without complaints, hoping  to go home.  Vitals were stable.  She was afebrile.  The dressing had  minimal drainage from where the drain was removed.  She had moderate edema  with 1+ effusion, but the wound was benign, edges well apposed.  She was not  therapeutic on the INR.  But, if her PT goals were met, she could go home on  Lovenox.   DIET:  Regular, as tolerated.   MEDICATIONS:  1. Mepergan Fortis 1-2 p.o. q.3-4h.  2. Lovenox 30 mg subcu b.i.d. until her INR was therapy, and Coumadin per     protocol with Gentiva to maintain INR between 2-3.   Dressing changes daily.  Return to clinic with Dr. Renae Fickle in 7-10 days for  followup.    ACTIVITY:  Weightbearing as tolerated with the knee immobilizer and walker  until cleared by Therapy.   CONDITION ON DISCHARGE:  Stable.      Madilyn Fireman, P.A.-C.                       Deidre Ala, M.D.    AC/MEDQ  D:  03/19/2003  T:  03/20/2003  Job:  [id number]

## 2011-02-11 ENCOUNTER — Other Ambulatory Visit: Payer: Self-pay | Admitting: Internal Medicine

## 2011-04-13 LAB — POCT I-STAT 4, (NA,K, GLUC, HGB,HCT)
Glucose, Bld: 81
HCT: 44
Hemoglobin: 15
Operator id: 268271
Potassium: 3.9
Sodium: 141

## 2012-03-04 ENCOUNTER — Other Ambulatory Visit (HOSPITAL_COMMUNITY): Payer: Self-pay | Admitting: Internal Medicine

## 2012-03-04 ENCOUNTER — Ambulatory Visit (HOSPITAL_COMMUNITY)
Admission: RE | Admit: 2012-03-04 | Discharge: 2012-03-04 | Disposition: A | Discharge status: Home / Self Care | Payer: 59 | Source: Ambulatory Visit | Attending: Internal Medicine | Admitting: Internal Medicine

## 2012-03-04 DIAGNOSIS — R110 Nausea: Secondary | ICD-10-CM

## 2012-03-04 DIAGNOSIS — R197 Diarrhea, unspecified: Secondary | ICD-10-CM | POA: Insufficient documentation

## 2012-03-04 DIAGNOSIS — R1012 Left upper quadrant pain: Secondary | ICD-10-CM

## 2012-03-04 DIAGNOSIS — R509 Fever, unspecified: Secondary | ICD-10-CM

## 2012-03-04 DIAGNOSIS — D3500 Benign neoplasm of unspecified adrenal gland: Secondary | ICD-10-CM | POA: Insufficient documentation

## 2012-03-04 DIAGNOSIS — R928 Other abnormal and inconclusive findings on diagnostic imaging of breast: Secondary | ICD-10-CM | POA: Insufficient documentation

## 2012-03-04 MED ORDER — IOHEXOL 300 MG/ML  SOLN
100.0000 mL | Freq: Once | INTRAMUSCULAR | Status: AC | PRN
  Administered 2012-03-04: 100 mL via INTRAVENOUS

## 2012-03-05 ENCOUNTER — Telehealth: Payer: Self-pay | Admitting: Internal Medicine

## 2012-03-05 NOTE — Telephone Encounter (Signed)
Pt c/o abdominal pain. Requesting pt be seen sooner than 1st available. Pt scheduled to see Willette Cluster NP 03/11/12@8 :Laure Kidney to call and notify pt of appt date and time. Office note to be faxed from office.

## 2012-03-11 ENCOUNTER — Ambulatory Visit (INDEPENDENT_AMBULATORY_CARE_PROVIDER_SITE_OTHER): Payer: 59 | Admitting: Nurse Practitioner

## 2012-03-11 ENCOUNTER — Encounter: Payer: Self-pay | Admitting: Nurse Practitioner

## 2012-03-11 VITALS — BP 140/80 | HR 70 | Ht 64.0 in | Wt 226.6 lb

## 2012-03-11 DIAGNOSIS — K219 Gastro-esophageal reflux disease without esophagitis: Secondary | ICD-10-CM

## 2012-03-11 DIAGNOSIS — R110 Nausea: Secondary | ICD-10-CM | POA: Insufficient documentation

## 2012-03-11 DIAGNOSIS — R1012 Left upper quadrant pain: Secondary | ICD-10-CM | POA: Insufficient documentation

## 2012-03-11 DIAGNOSIS — Z800 Family history of malignant neoplasm of digestive organs: Secondary | ICD-10-CM | POA: Insufficient documentation

## 2012-03-11 DIAGNOSIS — K2270 Barrett's esophagus without dysplasia: Secondary | ICD-10-CM

## 2012-03-11 MED ORDER — MOVIPREP 100 G PO SOLR: 1.0000 | Freq: Once | ORAL | Status: DC

## 2012-03-11 MED ORDER — MOVIPREP 100 G PO SOLR: 1.0000 | Freq: Once | ORAL | Status: AC

## 2012-03-11 NOTE — Patient Instructions (Addendum)
We have scheduled the Endoscopy /Colonoscopy with Dr. Yancey Flemings. Directions and brochure provided. We faxed the prescription for the colonoscopy prep to [facility] , We have also given you a $20 rebate for the Moviprep.  Fill out the back with name and address and mail it with the receipt from the pharmacy.

## 2012-03-11 NOTE — Progress Notes (Signed)
03/11/2012 Melanie [name] [id number] 27-Feb-1956   HISTORY OF PRESENT ILLNESS: Patient is a 56 year old female known to Dr. Marina Goodell for a history of GERD / Barrett's esophagus (2001) and several abdominal surgeries including gastric bypass (2006). Patient was last seen in April 2006 for followup of Barrett's esophagus. Upper endoscopy around that time showed possible short segment Barrett's and a 2 cm hiatal hernia. Esophageal biopsies compatible with mild inflammation but no intestinal metaplasia.   Patient here for evaluation of abdominal pain and nausea.  LUQ pain started three weeks ago. Pain is constant, not consistently related to anything in particular. She reports low grade temps. Her BMs are at baseline.  No NSAID use. Appetite is okay. She gets ocassional reflux symptoms at night.   Patient saw her primary care physician on 8/19 for evaluation of symptoms. A CT of the chest abdomen and pelvis was negative for any acute abnormalities. She had a 1.3cm subctaneous right breast nodule and a tiny left adrenal adenoma. Labs were done, I have requested reports.   Past Medical History  Diagnosis Date  . Pernicious anemia   . Arthritis   . Barrett's esophagus   . Fibromyalgia   . Gallstones   . Hypertension   . Obesity    Past Surgical History  Procedure Date  . Appendectomy   . Cholecystectomy   . Bariatric surgery   . Vaginal hysterectomy   . Ovaries removed     reports that she quit smoking about 19 years ago. Her smoking use included Cigarettes. She has never used smokeless tobacco. She reports that she does not drink alcohol or use illicit drugs. family history includes Cancer in her father; Colon cancer in her mother, paternal aunt, and unspecified family member; Colon polyps in her mother; and Heart disease in her mother. Allergies  Allergen Reactions  . Ace Inhibitors     REACTION: cough  . Penicillins   . Sulfa Antibiotics Nausea Only      Outpatient Encounter  Prescriptions as of 03/11/2012  Medication Sig Dispense Refill  . cyclobenzaprine (FLEXERIL) 10 MG tablet Take 10 mg by mouth at bedtime.      Marland Kitchen PARoxetine (PAXIL) 40 MG tablet TAKE ONE TABLET BY MOUTH EVERY DAY  30 tablet  0  . pregabalin (LYRICA) 75 MG capsule Take 75 mg by mouth daily.         REVIEW OF SYSTEMS  : All other systems reviewed and negative except where noted in the History of Present Illness.   PHYSICAL EXAM: BP 140/80  Pulse 70  Ht 5\' 4"  (1.626 m)  Wt 226 lb 9.6 oz (102.785 kg)  BMI 38.90 kg/m2 General: Well developed white female in no acute distress Head: Normocephalic and atraumatic Eyes:  sclerae anicteric,conjunctive pink. Ears: Normal auditory acuity Mouth: No deformity or lesions Neck: Supple, no masses.  Lungs: Clear throughout to auscultation Heart: Regular rate and rhythm Abdomen: Soft, non distended, nontender. No masses or hepatomegaly noted. Normal Bowel sounds Rectal: not done Musculoskeletal: Symmetrical with no gross deformities  Skin: No lesions on visible extremities Extremities: No edema or deformities noted Neurological: Alert oriented x 4, grossly nonfocal Cervical Nodes:  No significant cervical adenopathy Psychological:  Alert and cooperative. Normal mood and affect  ASSESSMENT AND PLAN:   1. Two week history of left upper quadrant pain and nausea. Recent labs and CTscan unrevealing. For further evaluation patient will be scheduled for an EGD. The benefits, risks, and potential complications of EGD with possible biopsies were  discussed with the patient and she agrees to proceed. For now, add daiy PPI, samples given.    2. History of Barrett's esophagus. Esophageal biopsies in 2001 compatible with a rare focus of intestinal metaplasia and moderate inflammation. Esophageal biopsies in 2006 however were negative for intestinal metaplasia. Further evaluation at time of upper endoscopy.  3. Colon cancer screening. Her last colonoscopy was  with Dr. Sabino Gasser in 1998. It was normal. She would like to proceed with colon cancer screening, will schedule colonoscopy to be done at same time as upper endoscopy   4. history of colon cancer in a first-degree relative, her mother had colon cancer in her sixties.Patient agrees to proceed with colonoscopy for screening.  The risks, benefits, and alternatives to colonoscopy with possible biopsy and possible polypectomy were discussed with the patient and they consent to proceed.    Addendum: Received labs from PCP dated 03/04/12. CBC was normal with hemoglobin of 13.6. Comprehensive metabolic profile unremarkable except for alkaline phosphatase mildly elevated at 139. Of note, urinalysis showed 2+ crystals, 0-2 red blood cells and 0-2 white blood cells.

## 2012-03-12 ENCOUNTER — Ambulatory Visit (AMBULATORY_SURGERY_CENTER): Payer: 59 | Admitting: Internal Medicine

## 2012-03-12 ENCOUNTER — Encounter: Payer: Self-pay | Admitting: Internal Medicine

## 2012-03-12 VITALS — BP 143/84 | HR 81 | Temp 99.2°F | Resp 11 | Ht 64.0 in | Wt 226.0 lb

## 2012-03-12 DIAGNOSIS — Z800 Family history of malignant neoplasm of digestive organs: Secondary | ICD-10-CM

## 2012-03-12 DIAGNOSIS — K2270 Barrett's esophagus without dysplasia: Secondary | ICD-10-CM

## 2012-03-12 DIAGNOSIS — Z1211 Encounter for screening for malignant neoplasm of colon: Secondary | ICD-10-CM

## 2012-03-12 DIAGNOSIS — K219 Gastro-esophageal reflux disease without esophagitis: Secondary | ICD-10-CM

## 2012-03-12 DIAGNOSIS — R1012 Left upper quadrant pain: Secondary | ICD-10-CM

## 2012-03-12 DIAGNOSIS — R110 Nausea: Secondary | ICD-10-CM

## 2012-03-12 MED ORDER — SODIUM CHLORIDE 0.9 % IV SOLN: 500.0000 mL | INTRAVENOUS | Status: DC

## 2012-03-12 NOTE — Op Note (Signed)
[facility] [address] [place], [address]   ENDOSCOPY PROCEDURE REPORT  PATIENT: [name], Melanie  MR#: [id number] BIRTHDATE: 1956-04-03 , 55  yrs. old GENDER: Female ENDOSCOPIST: Roxy Cedar, MD REFERRED BY:  Office Self, M.D. PROCEDURE DATE:  03/12/2012 PROCEDURE:  EGD, diagnostic ASA CLASS:     Class II INDICATIONS:  abdominal pain in upper left quadrant. MEDICATIONS: MAC sedation, administered by CRNA and propofol (Diprivan) 120mg  IV TOPICAL ANESTHETIC: none  DESCRIPTION OF PROCEDURE: After the risks benefits and alternatives of the procedure were thoroughly explained, informed consent was obtained.  The LB-GIF Q180 [id number] endoscope was introduced through the mouth and advanced to the proximal jejunum. Limited by prior surgery.  The instrument was slowly withdrawn as the mucosa was fully examined.    The upper, middle and distal third of the esophagus were carefully inspected and no abnormalities were noted.  The z-line was well seen at the GEJ.  The endoscope was pushed into the fundus which was normal including a retroflexed view.  Evidence of prior gastric bypass with large remenant. normal anastomosis. Normal proximal jejunum.  Retroflexed views revealed no abnormalities.     The scope was then withdrawn from the patient and the procedure completed.  COMPLICATIONS: There were no complications.  ENDOSCOPIC IMPRESSION: 1. Normal Endoscopy post gastric bypass surgery.No cause for pain found. I don't beleive that it is GI, possibly musculoskeletal  RECOMMENDATIONS: 1. Continue PPI until samples gone 2. Follow up prn   e]  eSigned:  Roxy Cedar, MD 03/12/2012 3:25 PM   QI:ONGEX Selena Batten, MD and The Patient

## 2012-03-12 NOTE — Progress Notes (Signed)
Reviewed and agree with management plan. Acelin Ferdig T. Mikena Masoner MD FACG 

## 2012-03-12 NOTE — Op Note (Signed)
[facility] [address] [place], [address]   COLONOSCOPY PROCEDURE REPORT  PATIENT: Melanie [name], Melanie [name]  MR#: [id number] BIRTHDATE: June 26, 1956 , 55  yrs. old GENDER: Female ENDOSCOPIST: Roxy Cedar, MD REFERRED XB:JYNWGN PROCEDURE DATE:  03/12/2012 PROCEDURE:   Colonoscopy, screening - high risk ASA CLASS:   Class II INDICATIONS:patient's immediate family history of colon cancer (Mom - 32's); last exam 1998 (elsewhere) negative. MEDICATIONS: MAC sedation, administered by CRNA and propofol (Diprivan) 400mg  IV  DESCRIPTION OF PROCEDURE:   After the risks benefits and alternatives of the procedure were thoroughly explained, informed consent was obtained.  A digital rectal exam revealed no abnormalities of the rectum.   The LB PCF-H180AL [id number]  endoscope was introduced through the anus and advanced to the cecum, which was identified by both the appendix and ileocecal valve. No adverse events experienced.   The quality of the prep was excellent, using MoviPrep  The instrument was then slowly withdrawn as the colon was fully examined.      COLON FINDINGS: Redundant colon. A normal appearing cecum, ileocecal valve, and appendiceal orifice were identified.  The ascending, hepatic flexure, transverse, splenic flexure, descending, sigmoid colon and rectum appeared unremarkable.  No polyps or cancers were seen.  Retroflexed views revealed internal hemorrhoids. The time to cecum=7 minutes 12 seconds.  Withdrawal time=19 minutes 29 seconds. The scope was withdrawn and the procedure completed. COMPLICATIONS: There were no complications.  ENDOSCOPIC IMPRESSION: 1. Normal colon  RECOMMENDATIONS: 1. Follow up colonoscopy in 5 years (family hx) 2. EGD today   eSigned:  Roxy Cedar, MD 03/12/2012 3:14 PM   cc: Pearson Grippe, MD and The Patient

## 2012-03-12 NOTE — Patient Instructions (Addendum)
YOU HAD AN ENDOSCOPIC PROCEDURE TODAY AT THE [facility]: Refer to the procedure report that was given to you for any specific questions about what was found during the examination.  If the procedure report does not answer your questions, please call your gastroenterologist to clarify.  If you requested that your care partner not be given the details of your procedure findings, then the procedure report has been included in a sealed envelope for you to review at your convenience later.  YOU SHOULD EXPECT: Some feelings of bloating in the abdomen. Passage of more gas than usual.  Walking can help get rid of the air that was put into your GI tract during the procedure and reduce the bloating. If you had a lower endoscopy (such as a colonoscopy or flexible sigmoidoscopy) you may notice spotting of blood in your stool or on the toilet paper. If you underwent a bowel prep for your procedure, then you may not have a normal bowel movement for a few days.  DIET: Your first meal following the procedure should be a light meal and then it is ok to progress to your normal diet.  A half-sandwich or bowl of soup is an example of a good first meal.  Heavy or fried foods are harder to digest and may make you feel nauseous or bloated.  Likewise meals heavy in dairy and vegetables can cause extra gas to form and this can also increase the bloating.  Drink plenty of fluids but you should avoid alcoholic beverages for 24 hours.  ACTIVITY: Your care partner should take you home directly after the procedure.  You should plan to take it easy, moving slowly for the rest of the day.  You can resume normal activity the day after the procedure however you should NOT DRIVE or use heavy machinery for 24 hours (because of the sedation medicines used during the test).    SYMPTOMS TO REPORT IMMEDIATELY: A gastroenterologist can be reached at any hour.  During normal business hours, 8:30 AM to 5:00 PM Monday through Friday,  call [phone].  After hours and on weekends, please call the GI answering service at [phone] who will take a message and have the physician on call contact you.   Following lower endoscopy (colonoscopy or flexible sigmoidoscopy):  Excessive amounts of blood in the stool  Significant tenderness or worsening of abdominal pains  Swelling of the abdomen that is new, acute  Fever of 100F or higher  Following upper endoscopy (EGD)  Vomiting of blood or coffee ground material  New chest pain or pain under the shoulder blades  Painful or persistently difficult swallowing  New shortness of breath  Fever of 100F or higher  Black, tarry-looking stools  FOLLOW UP: If any biopsies were taken you will be contacted by phone or by letter within the next 1-3 weeks.  Call your gastroenterologist if you have not heard about the biopsies in 3 weeks.  Our staff will call the home number listed on your records the next business day following your procedure to check on you and address any questions or concerns that you may have at that time regarding the information given to you following your procedure. This is a courtesy call and so if there is no answer at the home number and we have not heard from you through the emergency physician on call, we will assume that you have returned to your regular daily activities without incident.  SIGNATURES/CONFIDENTIALITY: You and/or your care   partner have signed paperwork which will be entered into your electronic medical record.  These signatures attest to the fact that that the information above on your After Visit Summary has been reviewed and is understood.  Full responsibility of the confidentiality of this discharge information lies with you and/or your care-partner.  

## 2012-03-12 NOTE — Progress Notes (Signed)
No complaints noted in the recovery room. Maw  Patient did not experience any of the following events: a burn prior to discharge; a fall within the facility; wrong site/side/patient/procedure/implant event; or a hospital transfer or hospital admission upon discharge from the facility. (G8907) Patient did not have preoperative order for IV antibiotic SSI prophylaxis. (G8918)  

## 2012-03-13 ENCOUNTER — Telehealth: Payer: Self-pay | Admitting: *Deleted

## 2012-03-13 NOTE — Telephone Encounter (Signed)
  Follow up Call-  Call back number 03/12/2012  Post procedure Call Back phone  # [phone]  Permission to leave phone message Yes     Left message.

## 2012-03-14 ENCOUNTER — Encounter: Payer: Self-pay | Admitting: Internal Medicine

## 2012-04-08 ENCOUNTER — Ambulatory Visit: Payer: 59 | Admitting: Internal Medicine

## 2014-02-19 ENCOUNTER — Other Ambulatory Visit: Payer: Self-pay

## 2014-02-19 DIAGNOSIS — Z1231 Encounter for screening mammogram for malignant neoplasm of breast: Secondary | ICD-10-CM

## 2014-02-23 ENCOUNTER — Ambulatory Visit
Admission: RE | Admit: 2014-02-23 | Discharge: 2014-02-23 | Disposition: A | Discharge status: Home / Self Care | Payer: No Typology Code available for payment source | Source: Ambulatory Visit

## 2014-02-23 DIAGNOSIS — Z1231 Encounter for screening mammogram for malignant neoplasm of breast: Secondary | ICD-10-CM

## 2014-02-24 ENCOUNTER — Other Ambulatory Visit: Payer: Self-pay | Admitting: Obstetrics and Gynecology

## 2014-02-24 DIAGNOSIS — N6320 Unspecified lump in the left breast, unspecified quadrant: Secondary | ICD-10-CM

## 2014-02-27 ENCOUNTER — Other Ambulatory Visit: Payer: Self-pay | Admitting: Obstetrics and Gynecology

## 2014-02-27 ENCOUNTER — Ambulatory Visit
Admission: RE | Admit: 2014-02-27 | Discharge: 2014-02-27 | Disposition: A | Discharge status: Home / Self Care | Payer: No Typology Code available for payment source | Source: Ambulatory Visit | Attending: Obstetrics and Gynecology | Admitting: Obstetrics and Gynecology

## 2014-02-27 ENCOUNTER — Encounter (INDEPENDENT_AMBULATORY_CARE_PROVIDER_SITE_OTHER): Payer: Self-pay

## 2014-02-27 DIAGNOSIS — N6320 Unspecified lump in the left breast, unspecified quadrant: Secondary | ICD-10-CM

## 2014-02-27 DIAGNOSIS — N6310 Unspecified lump in the right breast, unspecified quadrant: Secondary | ICD-10-CM

## 2015-02-09 ENCOUNTER — Encounter: Payer: Self-pay | Admitting: Internal Medicine

## 2015-05-18 ENCOUNTER — Ambulatory Visit (INDEPENDENT_AMBULATORY_CARE_PROVIDER_SITE_OTHER): Payer: Self-pay | Admitting: Internal Medicine

## 2015-05-18 ENCOUNTER — Encounter: Payer: Self-pay | Admitting: Internal Medicine

## 2015-05-18 VITALS — BP 136/76 | HR 78 | Temp 98.3°F | Ht 64.0 in | Wt 200.7 lb

## 2015-05-18 DIAGNOSIS — M797 Fibromyalgia: Secondary | ICD-10-CM

## 2015-05-18 DIAGNOSIS — I10 Essential (primary) hypertension: Secondary | ICD-10-CM

## 2015-05-18 DIAGNOSIS — R413 Other amnesia: Secondary | ICD-10-CM | POA: Insufficient documentation

## 2015-05-18 DIAGNOSIS — Z0000 Encounter for general adult medical examination without abnormal findings: Secondary | ICD-10-CM | POA: Insufficient documentation

## 2015-05-18 DIAGNOSIS — Z800 Family history of malignant neoplasm of digestive organs: Secondary | ICD-10-CM

## 2015-05-18 DIAGNOSIS — M170 Bilateral primary osteoarthritis of knee: Secondary | ICD-10-CM | POA: Insufficient documentation

## 2015-05-18 DIAGNOSIS — F322 Major depressive disorder, single episode, severe without psychotic features: Secondary | ICD-10-CM

## 2015-05-18 MED ORDER — PAROXETINE HCL 20 MG PO TABS: 50.0000 mg | ORAL_TABLET | Freq: Every day | ORAL | Status: DC

## 2015-05-18 MED ORDER — CYCLOBENZAPRINE HCL 10 MG PO TABS: 10.0000 mg | ORAL_TABLET | Freq: Three times a day (TID) | ORAL | Status: DC | PRN

## 2015-05-18 MED ORDER — TRAZODONE HCL 150 MG PO TABS
150.0000 mg | ORAL_TABLET | Freq: Every day | ORAL | Status: DC
Start: 2015-05-18 — End: 2015-08-04

## 2015-05-18 NOTE — Assessment & Plan Note (Addendum)
Patient has struggled with depression and anxiety for many years. She was previously given a diagnosis of panic attacks, then later adjustment disorder. She is currently on Paroxetine 40 mg QD and Trazodone 150 mg QHS. She does not feel that either of these medications are helping her depression. She does not describe any symptoms of panic attacks like she previously had years ago. She admits to 30 pound weight loss over the last 6 months which she attributes to the trazodone and decreased appetite. She has seen a psychiatrist in the past who wanted to get her off of paxil and started her on trazodone. Notably, he did not feel she had fibromyalgia which upset the patient. She is tearful during the interview. She lost her job, Insurance underwriter and house several months ago due to memory problems at work and inability to perform her tasks. She is living with her aunt and nieces. Her PHQ-9 is an 18 making her Severe Major Depression. She has had thoughts of suicide, but knows she could never do that to her family. She does not have an active plan and is not actively suicidal. She denies homicidal ideation.   Plan: -Increase Paroxetine to 50 mg daily -Continue Trazodone 150 mg QHS -Referral to [facility] for medication management and counseling services  -Follow up one month

## 2015-05-18 NOTE — Assessment & Plan Note (Signed)
Patient states over the last year she has developed trouble remembering where she placed her keys or if someone asks her to do something she will either forget what she was supposed to do or will not remember ever being asked to do something in the first place. She has forgotten her daughter's name once before. She, as above, lost her job due to these difficulties at work. She attribute her memory issues to taking gabapentin and lyrica. Gabapentin can cause depression, abnormal thinking; however, I am not sure this what was causing her issues. Lyrica can cause abnormal thinking, but I am not sure about memory loss. Despite being off of these medications, she continues to have memory issues. She is not aware of thyroid problems, but does admit to Vitamin B12 def for which she takes daily supplements. She scored a 28/30 on her MMSE. I feel most her her memory issues or mild cognitive impairment are a part of normal aging and due to her severe depression. We will work towards improving her depression and check labs at follow up visit once insurance is established.  Plan: -[facility] for depression, continue Paxil and Trazodone -At follow up visit, check TSH, Vitamin B12, CBC, CMET

## 2015-05-18 NOTE — Patient Instructions (Signed)
FOR YOUR DEPRESSION: FOLLOW UP WITH [facility], TAKE PAROXETINE 50 MG EVERY DAY, TAKE TRAZODONE 150 MG AT NIGHT, RETURN IN ONE MONTH.  FOR YOUR FIBROMYALGIA: TAKE FLEXERIL, WE WILL LOOK INTO RESTARTING LYRICA ONCE YOU GET INSURANCE.  FOR YOUR KNEE PAIN: TAKE TYLENOL, CONTINUE ICE/HEAT, WE WILL LOOK INTO OTHER MEDICATIONS AND POSSIBLE REFERRALS/IMAGING ONCE WE SET YOU UP WITH INSURANCE.  FOR YOUR BLOOD PRESSURE: DO NOT TAKE THE HYDROCHLOROTHIAZIDE OR ATENOLOL. CONTINUE CHECKING IT AT HOME. IF >150/90, WE WILL RESTART HCTZ.  WE WILL PLACE ORDERS FOR YOUR MAMMOGRAM AT THE FOLLOW UP VISIT.  RETURN IN ONE MONTH.

## 2015-05-18 NOTE — Assessment & Plan Note (Signed)
Colonoscopy: 02/2012 normal. Due 02/2017 due to family history of CC. No melena or hematochezia, constipation or diarrhea.  Mammogram: Due. Once insurance obtained, will refer. Last one normal 02/2014.  Pap smear: needs, cannot remember last pap. Needs to schedule appointment with me for pap.  Hep C/HIV: Will check once insurance obtained  Flu Shot: Awaiting insurance

## 2015-05-18 NOTE — Assessment & Plan Note (Signed)
Ideally would like to restart Lyrica once insurance obtained.

## 2015-05-18 NOTE — Assessment & Plan Note (Signed)
Patient is on supplementation. Given memory difficulty, we will recheck Vitamin b12 level at follow up visit after establishing health insurance.   Plan: -Check Vitamin B12 at follow up visit

## 2015-05-18 NOTE — Assessment & Plan Note (Signed)
Colonoscopy: 02/2012 normal. Due 02/2017 due to family history of CC. Mother diagnosed at age 60. No melena or hematochezia, constipation or diarrhea.

## 2015-05-18 NOTE — Assessment & Plan Note (Addendum)
Patient admits to chronic right knee pain that has persisted for several years but worsened in the last 3 weeks. It is a constant ache, but can become acutely worse with activity, but can wake her up at night as well. She denies problems ambulating. She has tried heat and ice for it as she cannot take NSAIDs due to stomach upset. She had the same problem in her left knee years ago, diagnosed with OA and had a knee replacement. She had a brace in the past, but cannot find it. Unfortunately, we do not have any brace samples in the clinic.   Plan: -Continue ice/heat -Try tylenol for pain -Avoid NSAIDs due to h/o gastritis  -May need imagining/referral to sports med in the future

## 2015-05-18 NOTE — Progress Notes (Signed)
Internal Medicine Clinic Attending  Case discussed with Dr. Richardson at the time of the visit.  We reviewed the resident's history and exam and pertinent patient test results.  I agree with the assessment, diagnosis, and plan of care documented in the resident's note. 

## 2015-05-18 NOTE — Progress Notes (Signed)
Subjective:    Patient ID: Melanie [name], female    DOB: 1955-12-13, 59 y.o.   MRN: [id number]  HPI Melanie [name] is a 59 y.o. female with PMHx of Depression, anxiety, fibromyalgia, low vitamin B12, low vitamin D, GERD, Barrett's esophagus (2001), osteoarthritis, HTN, and memory loss who presents to the clinic to establish care and specifically address HTN, depression, memory loss, and fibromylagia. Please see A&P for the status of the patient's chronic medical problems.   Depression: Patient has struggled with depression and anxiety for many years. She was previously given a diagnosis of panic attacks, then later adjustment disorder. She is currently on Paroxetine 40 mg QD and Trazodone 150 mg QHS. She does not feel that either of these medications are helping her depression. She does not describe any symptoms of panic attacks like she previously had years ago. She admits to 30 pound weight loss over the last 6 months which she attributes to the trazodone and decreased appetite. She has seen a psychiatrist in the past who wanted to get her off of paxil and started her on trazodone. Notably, he did not feel she had fibromyalgia which upset the patient. She is tearful during the interview. She lost her job, Insurance underwriter and house several months ago due to memory problems at work and inability to perform her tasks. She is living with her aunt and nieces. Her PHQ-9 is an 18 making her Severe Major Depression. She has had thoughts of suicide, but knows she could never do that to her family. She does not have an active plan and is not actively suicidal. She denies homicidal ideation.   Memory Loss: Patient states over the last year she has developed trouble remembering where she placed her keys or if someone asks her to do something she will either forget what she was supposed to do or will not remember ever being asked to do something in the first place. She has forgotten her daughter's name once before. She,  as above, lost her job due to these difficulties at work. She attribute her memory issues to taking gabapentin and lyrica. Gabapentin can cause depression, abnormal thinking; however, I am not sure this what was causing her issues. Lyrica can cause abnormal thinking, but I am not sure about memory loss. Despite being off of these medications, she continues to have memory issues. She is not aware of thyroid problems, but does admit to Vitamin B12 def for which she takes daily supplements. She scored a 28/30 on her MMSE.  HTN: Blood pressure has been well controlled at home off of her medications (HCTZ 25 QD and atenolol 25 QD) as her BP runs 120s/80s at home and is 135/76 here. She states her BP improved after she lost weight.  Osteoarthritis: Patient admits to chronic right knee pain that has persisted for several years but worsened in the last 3 weeks. It is a constant ache, but can become acutely worse with activity, but can wake her up at night as well. She denies problems ambulating. She has tried heat and ice for it as she cannot take NSAIDs due to stomach upset. She had the same problem in her left knee years ago, diagnosed with OA and had a knee replacement. She had a brace in the past, but cannot find it. Unfortunately, we do not have any brace samples in the clinic.    Past Medical History  Diagnosis Date  . Pernicious anemia   . Arthritis   . Barrett's  esophagus   . Fibromyalgia   . Gallstones   . Hypertension   . Obesity     Outpatient Encounter Prescriptions as of 05/18/2015  Medication Sig  . cyclobenzaprine (FLEXERIL) 10 MG tablet Take 1 tablet (10 mg total) by mouth 3 (three) times daily as needed for muscle spasms.  Marland Kitchen PARoxetine (PAXIL) 20 MG tablet Take 2.5 tablets (50 mg total) by mouth daily.  . traZODone (DESYREL) 150 MG tablet Take 1 tablet (150 mg total) by mouth at bedtime.  . [DISCONTINUED] cyclobenzaprine (FLEXERIL) 10 MG tablet Take 10 mg by mouth at bedtime.  .  [DISCONTINUED] PARoxetine (PAXIL) 40 MG tablet TAKE ONE TABLET BY MOUTH EVERY DAY  . [DISCONTINUED] pregabalin (LYRICA) 75 MG capsule Take 75 mg by mouth daily.   No facility-administered encounter medications on file as of 05/18/2015.    Family History  Problem Relation Age of Onset  . Colon cancer Mother   . Heart disease Mother   . Colon cancer Paternal Aunt   . Colon cancer      mat. cousin  . Esophageal cancer Father   . Hypertension Mother   . Stroke Mother   . Skin cancer Mother     Social History   Social History  . Marital Status: Divorced    Spouse Name: N/A  . Number of Children: 2  . Years of Education: N/A   Occupational History  . cma    Social History Main Topics  . Smoking status: Former Smoker    Types: Cigarettes    Quit date: 03/11/1993  . Smokeless tobacco: Never Used  . Alcohol Use: No  . Drug Use: No  . Sexual Activity: Not on file   Other Topics Concern  . Not on file   Social History Narrative   Review of Systems General: Admits to fatigue, decreased appetite, weight loss. Denies fever, chills, and diaphoresis.  Respiratory: Denies SOB, cough, DOE, chest tightness, and wheezing.   Cardiovascular: Denies chest pain and palpitations.  Gastrointestinal: Denies nausea, vomiting, abdominal pain, diarrhea, constipation, blood in stool  Endocrine: Denies hot or cold intolerance, polyuria, and polydipsia. Musculoskeletal: Admits to right knee pain, chronic myalgias. Denies back pain, joint swelling, and gait problem.  Skin: Denies pallor, rash and wounds.  Neurological: Denies dizziness, headaches, weakness, lightheadedness, numbness Psychiatric/Behavioral: Admits to depression, sadness, memory problems, sleep disturbance. Denies agitation, SI/HI.     Objective:   Physical Exam Filed Vitals:   05/18/15 0834  BP: 136/76  Pulse: 78  Temp: 98.3 F (36.8 C)  TempSrc: Oral  Height: 5\' 4"  (1.626 m)  Weight: 200 lb 11.2 oz (91.037 kg)  SpO2:  100%   General: Vital signs reviewed.  Patient is well-developed and well-nourished, in no acute distress and cooperative with exam.  HEENT: Supple, trachea midline, no thyromegaly. Cardiovascular: RRR, S1 normal, S2 normal, no murmurs, gallops, or rubs. Pulmonary/Chest: Clear to auscultation bilaterally, no wheezes, rales, or rhonchi. Abdominal: Soft, non-tender, non-distended, BS +  Musculoskeletal: No joint deformities, erythema, or effusions.  Extremities: No lower extremity edema bilaterally, pulses symmetric and intact bilaterally. Neurological: A&O x3 Skin: Warm, dry and intact. No rashes or erythema. Psychiatric: Abnormal mood and affect-depressed, flat. Speech and behavior is abnormal-tearful. Cognition and memory are normal.      Assessment & Plan:   Please see problem based assessment and plan.

## 2015-05-18 NOTE — Assessment & Plan Note (Signed)
BP Readings from Last 3 Encounters:  05/18/15 136/76  03/12/12 143/84  03/11/12 140/80    Lab Results  Component Value Date   NA 141 12/25/2008   K 4.1 12/25/2008   CREATININE 0.55 12/25/2008    Assessment: Blood pressure control:  At goal Progress toward BP goal:   Improved Comments: HTN: Blood pressure has been well controlled at home off of her medications (HCTZ 25 QD and atenolol 25 QD) as her BP runs 120s/80s at home and is 135/76 here. She states her BP improved after she lost weight.   Plan: Medications:  continue to monitor off of medications Educational resources provided: brochure, handout, video Self management tools provided:   Other plans: Return in one month

## 2015-06-03 ENCOUNTER — Ambulatory Visit: Payer: Self-pay | Admitting: Physician Assistant

## 2015-06-15 ENCOUNTER — Ambulatory Visit: Payer: Self-pay | Admitting: Internal Medicine

## 2015-06-15 ENCOUNTER — Encounter: Payer: Self-pay | Admitting: Internal Medicine

## 2015-08-04 ENCOUNTER — Other Ambulatory Visit: Payer: Self-pay | Admitting: Internal Medicine

## 2015-08-04 DIAGNOSIS — F322 Major depressive disorder, single episode, severe without psychotic features: Secondary | ICD-10-CM

## 2015-08-04 MED ORDER — TRAZODONE HCL 150 MG PO TABS: 150.0000 mg | ORAL_TABLET | Freq: Every day | ORAL | Status: AC

## 2015-08-04 NOTE — Telephone Encounter (Signed)
Pt requesting trazodone to be filled @ [facility].

## 2015-08-18 ENCOUNTER — Encounter: Payer: Self-pay | Admitting: Internal Medicine

## 2015-09-23 ENCOUNTER — Ambulatory Visit: Payer: Self-pay | Admitting: Internal Medicine

## 2015-09-23 ENCOUNTER — Encounter: Payer: Self-pay | Admitting: Internal Medicine

## 2016-02-17 ENCOUNTER — Emergency Department (HOSPITAL_COMMUNITY)
Admission: EM | Admit: 2016-02-17 | Discharge: 2016-02-17 | Disposition: A | Discharge status: Home / Self Care | Payer: No Typology Code available for payment source | Attending: Emergency Medicine | Admitting: Emergency Medicine

## 2016-02-17 ENCOUNTER — Emergency Department (HOSPITAL_COMMUNITY): Payer: No Typology Code available for payment source

## 2016-02-17 ENCOUNTER — Encounter (HOSPITAL_COMMUNITY): Payer: Self-pay

## 2016-02-17 DIAGNOSIS — Z79899 Other long term (current) drug therapy: Secondary | ICD-10-CM | POA: Insufficient documentation

## 2016-02-17 DIAGNOSIS — I10 Essential (primary) hypertension: Secondary | ICD-10-CM | POA: Diagnosis not present

## 2016-02-17 DIAGNOSIS — Z96651 Presence of right artificial knee joint: Secondary | ICD-10-CM | POA: Insufficient documentation

## 2016-02-17 DIAGNOSIS — R079 Chest pain, unspecified: Secondary | ICD-10-CM | POA: Diagnosis present

## 2016-02-17 DIAGNOSIS — Z87891 Personal history of nicotine dependence: Secondary | ICD-10-CM | POA: Insufficient documentation

## 2016-02-17 DIAGNOSIS — R0602 Shortness of breath: Secondary | ICD-10-CM | POA: Diagnosis not present

## 2016-02-17 HISTORY — DX: Major depressive disorder, single episode, unspecified: F32.9

## 2016-02-17 HISTORY — DX: Depression, unspecified: F32.A

## 2016-02-17 LAB — I-STAT TROPONIN, ED
Troponin i, poc: 0 ng/mL (ref 0.00–0.08)
Troponin i, poc: 0 ng/mL (ref 0.00–0.08)

## 2016-02-17 LAB — BASIC METABOLIC PANEL
Anion gap: 5 (ref 5–15)
BUN: 17 mg/dL (ref 6–20)
CO2: 26 mmol/L (ref 22–32)
Calcium: 8.6 mg/dL — ABNORMAL LOW (ref 8.9–10.3)
Chloride: 110 mmol/L (ref 101–111)
Creatinine, Ser: 0.76 mg/dL (ref 0.44–1.00)
GFR calc Af Amer: >60 mL/min (ref >60)
GFR calc non Af Amer: >60 mL/min (ref >60)
Glucose, Bld: 84 mg/dL (ref 65–99)
Potassium: 4.1 mmol/L (ref 3.5–5.1)
Sodium: 141 mmol/L (ref 135–145)

## 2016-02-17 LAB — CBC
HCT: 36.4 % (ref 36.0–46.0)
Hemoglobin: 11.5 g/dL — ABNORMAL LOW (ref 12.0–15.0)
MCH: 28.5 pg (ref 26.0–34.0)
MCHC: 31.6 g/dL (ref 30.0–36.0)
MCV: 90.3 fL (ref 78.0–100.0)
Platelets: 184 10*3/uL (ref 150–400)
RBC: 4.03 MIL/uL (ref 3.87–5.11)
RDW: 13.1 % (ref 11.5–15.5)
WBC: 5.9 10*3/uL (ref 4.0–10.5)

## 2016-02-17 LAB — BRAIN NATRIURETIC PEPTIDE: B Natriuretic Peptide: 178 pg/mL — ABNORMAL HIGH (ref 0.0–100.0)

## 2016-02-17 MED ORDER — ONDANSETRON HCL 4 MG/2ML IJ SOLN
4.0000 mg | Freq: Once | INTRAMUSCULAR | Status: AC
  Administered 2016-02-17: 4 mg via INTRAVENOUS
  Filled 2016-02-17: qty 2

## 2016-02-17 MED ORDER — NITROGLYCERIN 0.4 MG SL SUBL: 0.4000 mg | SUBLINGUAL_TABLET | SUBLINGUAL | Status: DC | PRN

## 2016-02-17 NOTE — ED Notes (Signed)
Pt states that she is no longer nauseated.

## 2016-02-17 NOTE — ED Notes (Signed)
Claiborne Billings Pa made aware of pts pain level, she asked that this RN place the pt on oxygen to see if that relieves the pts pain like it did in the drs office.

## 2016-02-17 NOTE — ED Triage Notes (Signed)
Per [facility]: Pt is coming from doctors office, [facility]. Pt is having pain that started in left side of neck radiated into shoulder and left side of chest for the last four days. Pain has been constant but has changed in intensity. No hx of this in the past. Pt has had some nausea, no vomiting, no shortness of breath, no diaphoresis. In the office they placed the pt on 3 L [place] and it resolved all of her pain. Pt has been 99% on room air. Pt received 324 ASA with EMS. 12 lead unremarkable.

## 2016-02-17 NOTE — ED Notes (Signed)
Patient transported to X-ray 

## 2016-02-17 NOTE — ED Provider Notes (Signed)
[facility] DEPT Provider Note   CSN: [id number] Arrival date & time: 02/17/16  1215  First Provider Contact:  None       History   Chief Complaint Chief Complaint  Patient presents with  . Chest Pain    HPI Melanie [name] is a 60 y.o. female who presents with chest pain. PMH significant for GERD, Barrett's esophagus, anemia, fibromyalgia, anxiety/depression. Surgical hx significant for gastric bypass. She states that she has had L ear/neck pain for the past 2 weeks. She feels like it has been radiating downwards to the L side of her chest, L lower ribs, and L shoulder. Her current chest pain is on the L side of her chest and has been constant but worse at times for the past 4 days. It came on gradually and feels like a pressure. It is not associated with exertion. She has never had this before. She reports she has noticed exertional SOB recently as well as some mild bilateral lower leg swelling. Reports chills, malaise, and nausea recently. Denies fever, LOC, diaphoresis, HA, URI symptoms, chest trauma, palpitations, cough, abdominal pain, emesis, diarrhea, dysuria. Denies HTN, Dyslipidemia, hx of cardiac or lung disease. She is a former smoker (30 years ago). Never had a stress test or seen a cardiologist. She went to her PCP today who did an EKG which had suspicious ischemic findings. She was put on O2 which resolved her pain. EMS was called and she received 324mg  ASA. She states the O2 helped her chest pain however she feels as though it is coming back.  HPI  Past Medical History:  Diagnosis Date  . Arthritis   . Barrett's esophagus   . Depression   . Fibromyalgia   . Gallstones   . Hypertension   . Obesity   . Pernicious anemia     Patient Active Problem List   Diagnosis Date Noted  . Severe major depression ([place]) 05/18/2015  . Memory impairment 05/18/2015  . Osteoarthritis of both knees 05/18/2015  . Healthcare maintenance 05/18/2015  . Family history of colon cancer  03/11/2012  . Fibromyalgia 10/03/2007  . Vitamin D deficiency 02/04/2007  . Vitamin B 12 deficiency 02/03/2007  . ANXIETY STATE NEC 02/03/2007  . BARRETT'S ESOPHAGUS 02/03/2007  . Essential hypertension 12/13/2006  . GERD 12/13/2006  . HIATAL HERNIA 12/13/2006    Past Surgical History:  Procedure Laterality Date  . APPENDECTOMY    . BARIATRIC SURGERY    . BREAST CYST EXCISION     rt  . CESAREAN SECTION    . CHOLECYSTECTOMY    . ovaries removed    . ROTATOR CUFF REPAIR     left shoulder  . TONSILLECTOMY    . TOTAL KNEE ARTHROPLASTY     lt  . VAGINAL HYSTERECTOMY      OB History    No data available       Home Medications    Prior to Admission medications   Medication Sig Start Date End Date Taking? Authorizing Provider  cyclobenzaprine (FLEXERIL) 10 MG tablet Take 1 tablet (10 mg total) by mouth 3 (three) times daily as needed for muscle spasms. 05/18/15   Florinda Marker, MD  PARoxetine (PAXIL) 20 MG tablet Take 2.5 tablets (50 mg total) by mouth daily. 05/18/15   Florinda Marker, MD  traZODone (DESYREL) 150 MG tablet Take 1 tablet (150 mg total) by mouth at bedtime. 08/04/15   Florinda Marker, MD    Family History Family History  Problem  Relation Age of Onset  . Colon cancer Mother   . Heart disease Mother   . Hypertension Mother   . Stroke Mother   . Skin cancer Mother   . Colon cancer Paternal Aunt   . Colon cancer      mat. cousin  . Esophageal cancer Father     Social History Social History  Substance Use Topics  . Smoking status: Former Smoker    Types: Cigarettes    Quit date: 03/11/1993  . Smokeless tobacco: Never Used  . Alcohol use Yes     Comment: rarely     Allergies   Ace inhibitors; Penicillins; and Sulfa antibiotics   Review of Systems Review of Systems  Constitutional: Positive for chills. Negative for diaphoresis and fever.  Respiratory: Positive for shortness of breath. Negative for cough.   Cardiovascular: Positive for chest pain  and leg swelling. Negative for palpitations.  Gastrointestinal: Negative for abdominal pain, diarrhea, nausea and vomiting.  Neurological: Negative for syncope and weakness.     Physical Exam Updated Vital Signs BP 136/75   Pulse 70   Temp 98.6 F (37 C) (Oral)   Resp 13   SpO2 100%   Physical Exam  Constitutional: She is oriented to person, place, and time. She appears well-developed and well-nourished. No distress.  HENT:  Head: Normocephalic and atraumatic.  Right Ear: Hearing, tympanic membrane, external ear and ear canal normal.  Left Ear: Hearing, tympanic membrane, external ear and ear canal normal.  Small non-tender L sided preauricular lymph node  Eyes: Conjunctivae are normal.  Neck: Neck supple.  Cardiovascular: Normal rate, regular rhythm, normal heart sounds and intact distal pulses.  Exam reveals no gallop and no friction rub.   No murmur heard. Pulmonary/Chest: Effort normal and breath sounds normal. No respiratory distress. She has no wheezes. She has no rales. She exhibits no tenderness.  Abdominal: Soft. Bowel sounds are normal. She exhibits no distension and no mass. There is no tenderness. There is no rebound and no guarding. No hernia.  Musculoskeletal: She exhibits no edema.  Neurological: She is alert and oriented to person, place, and time.  Skin: Skin is warm and dry. She is not diaphoretic.  Psychiatric: She has a normal mood and affect. Her behavior is normal.  Nursing note and vitals reviewed.    ED Treatments / Results  Labs (all labs ordered are listed, but only abnormal results are displayed) Labs Reviewed  BASIC METABOLIC PANEL - Abnormal; Notable for the following:       Result Value   Calcium 8.6 (*)    All other components within normal limits  CBC - Abnormal; Notable for the following:    Hemoglobin 11.5 (*)    All other components within normal limits  BRAIN NATRIURETIC PEPTIDE - Abnormal; Notable for the following:    B Natriuretic  Peptide 178.0 (*)    All other components within normal limits  I-STAT TROPOININ, ED  I-STAT TROPOININ, ED    EKG  EKG Interpretation  Date/Time:  Thursday February 17 2016 12:21:38 EDT Ventricular Rate:  73 PR Interval:    QRS Duration: 91 QT Interval:  395 QTC Calculation: 436 R Axis:   68 Text Interpretation:  Suspect sinus rhythm , artifact limits evaluation Otherwise no significant change Confirmed by [facility] MD, ERIN ([id number]) on 02/17/2016 3:00:10 PM       Radiology Dg Chest 2 View  Result Date: 02/17/2016 CLINICAL DATA:  Chest pain and nausea up, 4 days duration.  Abnormal EKG today. EXAM: CHEST  2 VIEW COMPARISON:  12/25/2008 FINDINGS: Heart size is normal. Mild tortuosity of the thoracic aorta. The lungs are clear. The vascularity is normal. No effusions. No acute bone finding. IMPRESSION: No active disease.  Slightly tortuous thoracic aorta. Electronically Signed   By: Nelson Chimes M.D.   On: 02/17/2016 14:01    Procedures Procedures (including critical care time)  Medications Ordered in ED Medications  nitroGLYCERIN (NITROSTAT) SL tablet 0.4 mg (not administered)  ondansetron (ZOFRAN) injection 4 mg (4 mg Intravenous Given 02/17/16 1318)     Initial Impression / Assessment and Plan / ED Course  I have reviewed the triage vital signs and the nursing notes.  Pertinent labs & imaging results that were available during my care of the patient were reviewed by me and considered in my medical decision making (see chart for details).  Clinical Course   60 year old female presents with chest pain. Some concerning features of her story with mixed typical and atypical factors. Chest pain work up is overall reassuring. Serial EKGs performed all of which are NSR and shows no significant change since last. EKG reviewed from clinic earlier today and it is unclear what ischemic changes the provider was seeing at the time. CXR is negative. Troponin is 0. Repeat Troponin is 0. Labs are  unremarkable. No significant hx of cardiac disease however does have some family hx of cardiac disease. Patient is non-smoker. HEART score is 4.  Shared visit with Dr. Billy Fischer who spoke at length with patient on chest pain obs vs d/c. Patient is requesting to go home and since work up has thus far been negative we feel this is reasonable with close follow up and Cardiology referral. Patient is NAD, non-toxic, with stable VS. Patient is informed of clinical course, understands medical decision making process, and agrees with plan. Opportunity for questions provided and all questions answered. Strict return precautions given if patient has ongoing or worsening pain.  Final Clinical Impressions(s) / ED Diagnoses   Final diagnoses:  Chest pain, unspecified chest pain type    New Prescriptions New Prescriptions   No medications on file     Recardo Evangelist, PA-C 02/18/16 [place], MD 02/18/16 1227

## 2016-02-17 NOTE — ED Notes (Signed)
Dr. Schlossman at bedside. 

## 2016-02-29 ENCOUNTER — Encounter: Payer: Self-pay | Admitting: Cardiology

## 2016-03-13 ENCOUNTER — Encounter (INDEPENDENT_AMBULATORY_CARE_PROVIDER_SITE_OTHER): Payer: Self-pay

## 2016-03-13 ENCOUNTER — Ambulatory Visit (INDEPENDENT_AMBULATORY_CARE_PROVIDER_SITE_OTHER): Payer: No Typology Code available for payment source | Admitting: Cardiology

## 2016-03-13 ENCOUNTER — Encounter: Payer: Self-pay | Admitting: Cardiology

## 2016-03-13 VITALS — BP 140/84 | HR 72 | Ht 64.5 in | Wt 167.4 lb

## 2016-03-13 DIAGNOSIS — R079 Chest pain, unspecified: Secondary | ICD-10-CM | POA: Diagnosis not present

## 2016-03-13 NOTE — Patient Instructions (Signed)
Medication Instructions:  Your physician recommends that you continue on your current medications as directed. Please refer to the Current Medication list given to you today.   Labwork: None ordered   Testing/Procedures: Your physician has requested that you have en exercise stress myoview. For further information please visit [email or web address]. Please follow instruction sheet, as given.    Follow-Up:  Your physician recommends that you schedule a follow-up appointment will be determined after stress test   Any Other Special Instructions Will Be Listed Below (If Applicable).     If you need a refill on your cardiac medications before your next appointment, please call your pharmacy.

## 2016-03-13 NOTE — Progress Notes (Signed)
Cardiology Office Note   Date:  03/13/2016   ID:  Melanie [name], DOB 09/09/1955, MRN [id number]  PCP:  Jani Gravel, MD  Cardiologist:  Daylee Delahoz Meredith Leeds, MD    Chief Complaint  Patient presents with  . New Patient (Initial Visit)    chest pain     History of Present Illness: Melanie [name] is a 60 y.o. female who presents today for cardiology evaluation.   PMH significant for GERD, Barrett's esophagus, anemia, fibromyalgia, anxiety/depression. Surgical hx significant for gastric bypass. Presented to the ER 02/17/16 with chest pain.  Chest pain is on the L side of her chest and had been constant. It came on gradually and feels like a pressure. She reports she has noticed exertional SOB recently as well as some mild bilateral lower leg swelling.  When she was in her PCPs office, her pain improved with oxygen.  She continues to have the chest pain today. She says that the pain is always there, but it certainly gets worse when she exerts herself and better when she rests. Her shortness of breath is also associated with exertion and improved with rest.  Today, she denies symptoms of palpitations,  orthopnea, PND, lower extremity edema, claudication, dizziness, presyncope, syncope, bleeding, or neurologic sequela. The patient is tolerating medications without difficulties and is otherwise without complaint today.    Past Medical History:  Diagnosis Date  . Arthritis   . Barrett's esophagus   . Depression   . Fibromyalgia   . Gallstones   . Hypertension   . Obesity   . Pernicious anemia    Past Surgical History:  Procedure Laterality Date  . APPENDECTOMY    . BARIATRIC SURGERY    . BREAST CYST EXCISION     rt  . CESAREAN SECTION    . CHOLECYSTECTOMY    . ovaries removed    . ROTATOR CUFF REPAIR     left shoulder  . TONSILLECTOMY    . TOTAL KNEE ARTHROPLASTY     lt  . VAGINAL HYSTERECTOMY       Current Outpatient Prescriptions  Medication Sig Dispense Refill  .  Cholecalciferol (VITAMIN D3) 5000 units TABS Take 5,000 Units by mouth daily.    . Cyanocobalamin (VITAMIN B-12) 1000 MCG/15ML LIQD Take 1 mL by mouth once a week.    . ferrous sulfate 325 (65 FE) MG tablet Take 325 mg by mouth daily with breakfast.    . PARoxetine (PAXIL) 20 MG tablet Take 40 mg by mouth daily.    . traZODone (DESYREL) 150 MG tablet Take 1 tablet (150 mg total) by mouth at bedtime. 30 tablet 5   No current facility-administered medications for this visit.     Allergies:   Ace inhibitors; Penicillins; and Sulfa antibiotics   Social History:  The patient  reports that she quit smoking about 23 years ago. Her smoking use included Cigarettes. She has never used smokeless tobacco. She reports that she drinks alcohol. She reports that she uses drugs, including Marijuana.   Family History:  The patient's family history includes Colon cancer in her mother and paternal aunt; Esophageal cancer in her father; Heart disease in her mother; Hypertension in her mother; Skin cancer in her mother; Stroke in her mother.    ROS:  Please see the history of present illness.   Otherwise, review of systems is positive for chest pain, DOE, nausea, back pain, dizziness, headaches, chills, fatigue.   All other systems are reviewed and negative.  PHYSICAL EXAM: VS:  BP 140/84   Pulse 72   Ht 5' 4.5" (1.638 m)   Wt 167 lb 6.4 oz (75.9 kg)   BMI 28.29 kg/m  , BMI Body mass index is 28.29 kg/m. GEN: Well nourished, well developed, in no acute distress  HEENT: normal  Neck: no JVD, carotid bruits, or masses Cardiac: RRR; no murmurs, rubs, or gallops,no edema  Respiratory:  clear to auscultation bilaterally, normal work of breathing GI: soft, nontender, nondistended, + BS MS: no deformity or atrophy  Skin: warm and dry Neuro:  Strength and sensation are intact Psych: euthymic mood, full affect  EKG:  EKG is ordered today. Personal review of the ekg ordered shows sinus rhythm, rate  72  Recent Labs: 02/17/2016: B Natriuretic Peptide 178.0; BUN 17; Creatinine, Ser 0.76; Hemoglobin 11.5; Platelets 184; Potassium 4.1; Sodium 141    Lipid Panel     Component Value Date/Time   CHOL 143 09/21/2006 1057   TRIG 71 09/21/2006 1057   HDL 52.6 09/21/2006 1057   CHOLHDL 2.7 CALC 09/21/2006 1057   VLDL 14 09/21/2006 1057   LDLCALC 76 09/21/2006 1057     Wt Readings from Last 3 Encounters:  03/13/16 167 lb 6.4 oz (75.9 kg)  05/18/15 200 lb 11.2 oz (91 kg)  03/12/12 226 lb (102.5 kg)      Other studies Reviewed: Additional studies/ records that were reviewed today include: Epic notes, PCP notes   ASSESSMENT AND PLAN:  1.  Chest pain: Her chest pain has both typical and atypical features. The pain is always there, but it does worsen when she exerts herself and improved when she rests, and is associated with shortness of breath. Due to the both atypical and typical nature of her discomfort, we'll order a rest stress Myoview to further determine if her chest pain could be cardiac.    Current medicines are reviewed at length with the patient today.   The patient does not have concerns regarding her medicines.  The following changes were made today:  none  Labs/ tests ordered today include:  Orders Placed This Encounter  Procedures  . Myocardial Perfusion Imaging  . EKG 12-Lead     Disposition:   FU with [name] pending results  Signed, Zanylah Hardie Meredith Leeds, MD  03/13/2016 4:05 PM     [address] [place] [id number] [phone] (office) [phone] (fax)

## 2016-03-15 ENCOUNTER — Telehealth (HOSPITAL_COMMUNITY): Payer: Self-pay | Admitting: *Deleted

## 2016-03-15 NOTE — Telephone Encounter (Signed)
Left message on voicemail per DPR in reference to upcoming appointment scheduled on 03/17/16 with detailed instructions given per Myocardial Perfusion Study Information Sheet for the test. LM to arrive 15 minutes early, and that it is imperative to arrive on time for appointment to keep from having the test rescheduled. If you need to cancel or reschedule your appointment, please call the office within 24 hours of your appointment. Failure to do so may result in a cancellation of your appointment, and a $50 no show fee. Phone number given for call back for any questions. Melanie [name]    

## 2016-03-17 ENCOUNTER — Encounter (HOSPITAL_COMMUNITY): Payer: No Typology Code available for payment source

## 2016-03-23 ENCOUNTER — Telehealth (HOSPITAL_COMMUNITY): Payer: Self-pay | Admitting: *Deleted

## 2016-03-24 ENCOUNTER — Ambulatory Visit (HOSPITAL_COMMUNITY): Payer: No Typology Code available for payment source | Attending: Cardiovascular Disease

## 2016-03-24 DIAGNOSIS — R079 Chest pain, unspecified: Secondary | ICD-10-CM | POA: Diagnosis not present

## 2016-03-24 DIAGNOSIS — Z8249 Family history of ischemic heart disease and other diseases of the circulatory system: Secondary | ICD-10-CM | POA: Insufficient documentation

## 2016-03-24 DIAGNOSIS — I10 Essential (primary) hypertension: Secondary | ICD-10-CM | POA: Insufficient documentation

## 2016-03-24 LAB — MYOCARDIAL PERFUSION IMAGING
LV dias vol: 109 mL (ref 46–106)
LV sys vol: 38 mL
Peak HR: 133 {beats}/min
RATE: 0.28
Rest HR: 66 {beats}/min
SDS: 3
SRS: 0
SSS: 3
TID: 0.98

## 2016-03-24 MED ORDER — REGADENOSON 0.4 MG/5ML IV SOLN
0.4000 mg | Freq: Once | INTRAVENOUS | Status: AC
  Administered 2016-03-24: 0.4 mg via INTRAVENOUS

## 2016-03-24 MED ORDER — TECHNETIUM TC 99M TETROFOSMIN IV KIT
10.2000 | PACK | Freq: Once | INTRAVENOUS | Status: AC | PRN
  Administered 2016-03-24: 10 via INTRAVENOUS
  Filled 2016-03-24: qty 10

## 2016-03-24 MED ORDER — TECHNETIUM TC 99M TETROFOSMIN IV KIT
31.7000 | PACK | Freq: Once | INTRAVENOUS | Status: AC | PRN
  Administered 2016-03-24: 31.7 via INTRAVENOUS
  Filled 2016-03-24: qty 32

## 2016-03-30 ENCOUNTER — Telehealth: Payer: Self-pay | Admitting: Cardiology

## 2016-03-30 NOTE — Telephone Encounter (Signed)
New message ° ° ° ° ° °Returning a call to the nurse to get stress test results °

## 2016-03-30 NOTE — Telephone Encounter (Signed)
Notified the pt of her myoview results per Dr Curt Bears, as mentioned below:   Notes Recorded by Will Meredith Leeds, MD on 03/28/2016 at 2:21 PM EDT myoview low risk. No further eval needed.   Pt verbalized understanding.

## 2016-04-17 ENCOUNTER — Emergency Department (HOSPITAL_COMMUNITY): Payer: PRIVATE HEALTH INSURANCE

## 2016-04-17 ENCOUNTER — Emergency Department (HOSPITAL_COMMUNITY)
Admission: EM | Admit: 2016-04-17 | Discharge: 2016-04-17 | Disposition: A | Discharge status: Home / Self Care | Payer: PRIVATE HEALTH INSURANCE | Attending: Emergency Medicine | Admitting: Emergency Medicine

## 2016-04-17 ENCOUNTER — Encounter (HOSPITAL_COMMUNITY): Payer: Self-pay | Admitting: Emergency Medicine

## 2016-04-17 DIAGNOSIS — Y939 Activity, unspecified: Secondary | ICD-10-CM | POA: Insufficient documentation

## 2016-04-17 DIAGNOSIS — I10 Essential (primary) hypertension: Secondary | ICD-10-CM | POA: Insufficient documentation

## 2016-04-17 DIAGNOSIS — S161XXA Strain of muscle, fascia and tendon at neck level, initial encounter: Secondary | ICD-10-CM | POA: Diagnosis not present

## 2016-04-17 DIAGNOSIS — R10812 Left upper quadrant abdominal tenderness: Secondary | ICD-10-CM | POA: Insufficient documentation

## 2016-04-17 DIAGNOSIS — Z96652 Presence of left artificial knee joint: Secondary | ICD-10-CM | POA: Insufficient documentation

## 2016-04-17 DIAGNOSIS — R51 Headache: Secondary | ICD-10-CM | POA: Diagnosis not present

## 2016-04-17 DIAGNOSIS — S199XXA Unspecified injury of neck, initial encounter: Secondary | ICD-10-CM | POA: Diagnosis present

## 2016-04-17 DIAGNOSIS — M545 Low back pain, unspecified: Secondary | ICD-10-CM

## 2016-04-17 DIAGNOSIS — Z87891 Personal history of nicotine dependence: Secondary | ICD-10-CM | POA: Diagnosis not present

## 2016-04-17 DIAGNOSIS — Y999 Unspecified external cause status: Secondary | ICD-10-CM | POA: Diagnosis not present

## 2016-04-17 DIAGNOSIS — Y92410 Unspecified street and highway as the place of occurrence of the external cause: Secondary | ICD-10-CM | POA: Diagnosis not present

## 2016-04-17 LAB — I-STAT TROPONIN, ED: Troponin i, poc: 0 ng/mL (ref 0.00–0.08)

## 2016-04-17 LAB — BASIC METABOLIC PANEL
Anion gap: 9 (ref 5–15)
BUN: 13 mg/dL (ref 6–20)
CO2: 23 mmol/L (ref 22–32)
Calcium: 8.9 mg/dL (ref 8.9–10.3)
Chloride: 109 mmol/L (ref 101–111)
Creatinine, Ser: 0.83 mg/dL (ref 0.44–1.00)
GFR calc Af Amer: >60 mL/min (ref >60)
GFR calc non Af Amer: >60 mL/min (ref >60)
Glucose, Bld: 88 mg/dL (ref 65–99)
Potassium: 4.1 mmol/L (ref 3.5–5.1)
Sodium: 141 mmol/L (ref 135–145)

## 2016-04-17 LAB — CBC
HCT: 38.7 % (ref 36.0–46.0)
Hemoglobin: 12.1 g/dL (ref 12.0–15.0)
MCH: 27.6 pg (ref 26.0–34.0)
MCHC: 31.3 g/dL (ref 30.0–36.0)
MCV: 88.2 fL (ref 78.0–100.0)
Platelets: 217 10*3/uL (ref 150–400)
RBC: 4.39 MIL/uL (ref 3.87–5.11)
RDW: 14 % (ref 11.5–15.5)
WBC: 7.6 10*3/uL (ref 4.0–10.5)

## 2016-04-17 MED ORDER — IOPAMIDOL (ISOVUE-300) INJECTION 61%
INTRAVENOUS | Status: AC
  Administered 2016-04-17: 100 mL
  Filled 2016-04-17: qty 100

## 2016-04-17 MED ORDER — DIPHENHYDRAMINE HCL 50 MG/ML IJ SOLN
12.5000 mg | Freq: Once | INTRAMUSCULAR | Status: AC
  Administered 2016-04-17: 12.5 mg via INTRAVENOUS
  Filled 2016-04-17: qty 1

## 2016-04-17 MED ORDER — MORPHINE SULFATE (PF) 2 MG/ML IV SOLN
2.0000 mg | Freq: Once | INTRAVENOUS | Status: AC
  Administered 2016-04-17: 2 mg via INTRAVENOUS
  Filled 2016-04-17: qty 1

## 2016-04-17 MED ORDER — METHOCARBAMOL 500 MG PO TABS: 500.0000 mg | ORAL_TABLET | Freq: Two times a day (BID) | ORAL | 0 refills | Status: DC

## 2016-04-17 MED ORDER — NAPROXEN 500 MG PO TABS: 500.0000 mg | ORAL_TABLET | Freq: Two times a day (BID) | ORAL | 0 refills | Status: DC

## 2016-04-17 MED ORDER — METOCLOPRAMIDE HCL 5 MG/ML IJ SOLN
10.0000 mg | Freq: Once | INTRAMUSCULAR | Status: AC
  Administered 2016-04-17: 10 mg via INTRAVENOUS
  Filled 2016-04-17: qty 2

## 2016-04-17 MED ORDER — ONDANSETRON 4 MG PO TBDP
ORAL_TABLET | ORAL | Status: AC
  Filled 2016-04-17: qty 1

## 2016-04-17 MED ORDER — ONDANSETRON 4 MG PO TBDP
4.0000 mg | ORAL_TABLET | Freq: Once | ORAL | Status: AC
  Administered 2016-04-17: 4 mg via ORAL

## 2016-04-17 MED ORDER — KETOROLAC TROMETHAMINE 30 MG/ML IJ SOLN
30.0000 mg | Freq: Once | INTRAMUSCULAR | Status: AC
Start: 2016-04-17 — End: 2016-04-17
  Administered 2016-04-17: 30 mg via INTRAVENOUS
  Filled 2016-04-17: qty 1

## 2016-04-17 MED ORDER — HYDROCODONE-ACETAMINOPHEN 5-325 MG PO TABS: 1.0000 | ORAL_TABLET | ORAL | 0 refills | Status: DC | PRN
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
## 2016-04-17 NOTE — ED Notes (Signed)
Declined W/C at D/C and was escorted to lobby by RN. 

## 2016-04-17 NOTE — Discharge Instructions (Signed)
Your imaging studies were normal and reassuring. You should expect to be quite sore over the next few days. Follow up with your primary care provider this week. Call Dr. Sid Falcon office to schedule an appointment as needed for orthopedic follow up
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
## 2016-04-17 NOTE — ED Notes (Signed)
Patient transferred to CT
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
## 2016-12-13 ENCOUNTER — Ambulatory Visit (INDEPENDENT_AMBULATORY_CARE_PROVIDER_SITE_OTHER): Payer: No Typology Code available for payment source | Admitting: Neurology

## 2016-12-13 ENCOUNTER — Encounter: Payer: Self-pay | Admitting: Neurology

## 2016-12-13 VITALS — BP 120/68 | HR 80 | Resp 16 | Ht 64.5 in | Wt 187.0 lb

## 2016-12-13 DIAGNOSIS — R202 Paresthesia of skin: Secondary | ICD-10-CM | POA: Diagnosis not present

## 2016-12-13 DIAGNOSIS — L299 Pruritus, unspecified: Secondary | ICD-10-CM | POA: Diagnosis not present

## 2016-12-13 DIAGNOSIS — R531 Weakness: Secondary | ICD-10-CM | POA: Diagnosis not present

## 2016-12-13 NOTE — Progress Notes (Signed)
Subjective:    Patient ID: Melanie [name] is a 61 y.o. female.  HPI     Star Age, MD, PhD [facility] [address] P.O. [place], [place] [address]  Dear Dr. Shelia Media,   I saw your patient, Melanie [name], upon your kind request, in my neurologic clinic today, for initial consultation of her left-sided pruritus. The patient is unaccompanied today. As you know, [name] is a 61 year old right-handed woman with an underlying medical history of arthritis, Barrett's esophagus, depression, overweight state, hypertension, fibromyalgia, pernicious anemia, and vitamin D deficiency, who reports a 6 month history of problems with the left side of her body and her face, which includes a variety of symptoms, none of these started abruptly and none of these are constant, mostly intermittent. She feels that she has had left-sided ear aches, itching of her face, numbness, left eye blurriness, itching affecting her face, arm and leg for the past 2 months or month and a half at least. She has had blurry vision in the past 3 months. She has found it difficult to drive at night. She had an eye check last year. She wears contact lens on the left. She reports no sudden onset of symptoms and no permanent numbness or weakness. Sometimes she feels that her left grip strength is weak. She wonders if her fibromyalgia symptoms are flaring up. She has some tingling in the left shoulder area. She felt a knot in front of the left ear and was seen by ENT for this. She was told that it could be taken off. Sometimes she feels a knot around the left shoulder joint, she did have left shoulder surgery for rotator cuff. I reviewed your office note from 12/01/2016, at which time she saw Dr. Maudie Mercury.  She had a head CT and cervical spine CT without contrast on 04/17/2016 which I reviewed: IMPRESSION: No acute intracranial abnormality.   Scattered white matter disease is nonspecific but may  represent chronic small vessel ischemic changes.  No acute abnormality in the cervical spine.  She saw an ENT in [place] last year in September due to recurrent sore throat and L ear ache, but no dermatologist for her itching. She has noted no rash. She does scratch sometimes but tries to avoid scratching with her nails.her itching is daily, it is intermittent, sometimes it feels like something under the skin. She does not know if she snores. She lives with 2 cousins. She has 2 children. She quit smoking 25 years ago. She drinks alcohol maybe a couple of times per month. Caffeine in the form of coffee, usually 2 cups per day.  Her Past Medical History Is Significant For: Past Medical History:  Diagnosis Date  . Arthritis   . Barrett's esophagus   . Depression   . Fibromyalgia   . Gallstones   . Hypertension   . Obesity   . Pernicious anemia     Her Past Surgical History Is Significant For: Past Surgical History:  Procedure Laterality Date  . APPENDECTOMY    . BARIATRIC SURGERY    . BREAST CYST EXCISION     rt  . CESAREAN SECTION    . CHOLECYSTECTOMY    . ovaries removed    . ROTATOR CUFF REPAIR     left shoulder  . TONSILLECTOMY    . TOTAL KNEE ARTHROPLASTY     lt  . VAGINAL HYSTERECTOMY      Her Family History Is Significant For: Family  History  Problem Relation Age of Onset  . Colon cancer Mother   . Heart disease Mother   . Hypertension Mother   . Stroke Mother   . Skin cancer Mother   . Colon cancer Paternal Aunt   . Colon cancer Unknown        mat. cousin  . Esophageal cancer Father     Her Social History Is Significant For: Social History   Social History  . Marital status: Divorced    Spouse name: N/A  . Number of children: 2  . Years of education: college   Occupational History  . cma    Social History Main Topics  . Smoking status: Former Smoker    Types: Cigarettes    Quit date: 03/11/1993  . Smokeless tobacco: Never Used  . Alcohol  use Yes     Comment: rarely  . Drug use: No     Comment: "years ago"  . Sexual activity: Not Asked   Other Topics Concern  . None   Social History Narrative   Drinks 2-4 caffeine drinks a day     Her Allergies Are:  Allergies  Allergen Reactions  . Ace Inhibitors     REACTION: cough  . Penicillins Swelling  . Sulfa Antibiotics Nausea Only  :   Her Current Medications Are:  Outpatient Encounter Prescriptions as of 12/13/2016  Medication Sig  . Biotin 5000 MCG SUBL Place under the tongue 2 (two) times daily.  . Cholecalciferol (VITAMIN D3) 2000 units capsule Take 2,000 Units by mouth 3 (three) times daily.   . cyclobenzaprine (FLEXERIL) 5 MG tablet Take 5 mg by mouth as needed for muscle spasms.  . ferrous sulfate 325 (65 FE) MG tablet Take 325 mg by mouth daily with breakfast.  . PARoxetine (PAXIL) 20 MG tablet Take 40 mg by mouth daily.  . promethazine (PHENERGAN) 25 MG tablet Take 12.5 mg by mouth as needed for nausea or vomiting.  . traZODone (DESYREL) 150 MG tablet Take 1 tablet (150 mg total) by mouth at bedtime.  . vitamin B-12 (CYANOCOBALAMIN) 1000 MCG tablet Take 1,500 mcg by mouth 2 (two) times daily.   . [DISCONTINUED] HYDROcodone-acetaminophen (NORCO/VICODIN) 5-325 MG tablet Take 1-2 tablets by mouth every 4 (four) hours as needed for severe pain.  . [DISCONTINUED] methocarbamol (ROBAXIN) 500 MG tablet Take 1 tablet (500 mg total) by mouth 2 (two) times daily.  . [DISCONTINUED] naproxen (NAPROSYN) 500 MG tablet Take 1 tablet (500 mg total) by mouth 2 (two) times daily.   No facility-administered encounter medications on file as of 12/13/2016.   :   Review of Systems:  Out of a complete 14 point review of systems, all are reviewed and negative with the exception of these symptoms as listed below:  Review of Systems  Neurological:       Patient states that she has had a "problem" with the L side of her body for the last 6 months. L sided ear aches, itching, and  numbness. L eye blurriness. Patient was told that her L grip was weaker than R.     Objective:  Neurologic Exam  Physical Exam Physical Examination:   Vitals:   12/13/16 1520  BP: 120/68  Pulse: 80  Resp: 16    General Examination: The patient is a very pleasant 61 y.o. female in no acute distress. She appears well-developed and well-nourished and well groomed. Mildly anxious appearing.   HEENT: Normocephalic, atraumatic, pupils are equal, round and reactive to light  and accommodation. L eye contact lens. Funduscopic exam is normal with sharp disc margins noted. Extraocular tracking is good without limitation to gaze excursion or nystagmus noted. Normal smooth pursuit is noted. Hearing is grossly intact. Face is symmetric with normal facial animation and normal facial sensation. Speech is clear with no dysarthria noted. There is no hypophonia. There is no lip, neck/head, jaw or voice tremor. Neck is supple with full range of passive and active motion. There are no carotid bruits on auscultation. Oropharynx exam reveals: moderate mouth dryness, adequate dental hygiene. Tongue protrudes centrally and palate elevates symmetrically.   Chest: Clear to auscultation without wheezing, rhonchi or crackles noted.  Heart: S1+S2+0, regular and normal without murmurs, rubs or gallops noted.   Abdomen: Soft, non-tender and non-distended with normal bowel sounds appreciated on auscultation.  Extremities: There is no pitting edema in the distal lower extremities bilaterally. Pedal pulses are intact.  Skin: Warm and dry without trophic changes noted.  Musculoskeletal: exam reveals no obvious joint deformities, tenderness or joint swelling or erythema.   Neurologically:  Mental status: The patient is awake, alert and oriented in all 4 spheres. Her immediate and remote memory, attention, language skills and fund of knowledge are appropriate. There is no evidence of aphasia, agnosia, apraxia or anomia.  Speech is clear with normal prosody and enunciation. Thought process is linear. Mood is normal and affect is normal.  Cranial nerves II - XII are as described above under HEENT exam. In addition: shoulder shrug is normal with equal shoulder height noted. Motor exam: Normal bulk, strength and tone is noted. There is no drift, tremor or rebound. Romberg is negative. Reflexes are 2+ throughout. Babinski: Toes are flexor bilaterally. Fine motor skills and coordination: intact with normal finger taps, normal hand movements, normal rapid alternating patting, normal foot taps and normal foot agility.  Cerebellar testing: No dysmetria or intention tremor on finger to nose testing. Heel to shin is unremarkable bilaterally. There is no truncal or gait ataxia.  Sensory exam: intact to light touch, pinprick, vibration, temperature sense in the upper and lower extremities.  Gait, station and balance: She stands easily. No veering to one side is noted. No leaning to one side is noted. Posture is age-appropriate and stance is narrow based. Gait shows normal stride length and normal pace. No problems turning are noted. Tandem walk is unremarkable.  Assessment and Plan:   In summary, WILL SCHIER is a very pleasant 61 y.o.-year old female  with an underlying medical history of arthritis, Barrett's esophagus, depression, overweight state, hypertension, fibromyalgia, pernicious anemia, and vitamin D deficiency, who reports an approximately 6-7 month history of intermittent symptoms affecting mostly her left side including face, arm and leg, she has had intermittent tingling, itching sensation, subjective weakness in her grip strength. Symptoms have been gradual in onset, have been fluctuating, intermittent, not necessarily progressive as she has days where she feels improved. On examination, she has a nonfocal neurological exam. She is reassured in that regard, and addition, she had a recent head CT and cervical spine CT  which were negative for any acute changes, nevertheless, because of the one-sided nature of her symptoms I suggested we proceed with a brain MRI with and without contrast as well as cervical spine MRI with and without contrast. We will call her with her test results and take it from there. I'm not sure, as to how to explain her symptoms. She is encouraged to consider seeing a dermatologist. No telltale obvious rash  was noted today however. Furthermore, she is encouraged to see her eye doctor again as she has had intermittent blurry vision in the past 3 months. Again, we will call her with her MRI results. I answered all her questions today and she was in agreement. Thank you very much for allowing me to participate in the care of this nice patient. If I can be of any further assistance to you please do not hesitate to call me at [phone].  Sincerely,   Star Age, MD, PhD

## 2016-12-13 NOTE — Patient Instructions (Addendum)
I am not sure how to explain your symptoms. Your exam looks good today.  We will do a brain scan, called MRI and call you with the test results. We will have to schedule you for this on a separate date. This test requires authorization from your insurance, and we will take care of the insurance process. We will do a cervical spine (i.e. neck) MRI to look for degenerative changes and for comparison with your previous neck CT. You may benefit from seeing your eye doctor again.  You may benefit from seeing a dermatologist.

## 2016-12-27 ENCOUNTER — Encounter: Payer: Self-pay | Admitting: Internal Medicine

## 2016-12-29 ENCOUNTER — Ambulatory Visit
Admission: RE | Admit: 2016-12-29 | Discharge: 2016-12-29 | Disposition: A | Discharge status: Home / Self Care | Payer: No Typology Code available for payment source | Source: Ambulatory Visit | Attending: Neurology | Admitting: Neurology

## 2016-12-29 DIAGNOSIS — R202 Paresthesia of skin: Secondary | ICD-10-CM

## 2016-12-29 DIAGNOSIS — R531 Weakness: Secondary | ICD-10-CM

## 2016-12-29 DIAGNOSIS — L299 Pruritus, unspecified: Secondary | ICD-10-CM

## 2016-12-29 MED ORDER — GADOBENATE DIMEGLUMINE 529 MG/ML IV SOLN
20.0000 mL | Freq: Once | INTRAVENOUS | Status: AC | PRN
  Administered 2016-12-29: 17 mL via INTRAVENOUS

## 2017-01-01 ENCOUNTER — Telehealth: Payer: Self-pay | Admitting: Neurology

## 2017-01-01 NOTE — Telephone Encounter (Signed)
Pt returned call. I was able to discuss the results of the MRI of head and neck. Explained that the MRI had some white mater changes. I encouraged pt to make sure she is keeping her cholesterol levels good and maintaining good weight mgt. Pt verbalized understanding and had no other further questions

## 2017-01-01 NOTE — Progress Notes (Signed)
Please call patient regarding the recent brain MRI: The brain scan showed a normal structure of the brain and no significant volume loss which we call atrophy. There were changes in the deeper structures of the brain, which we call white matter changes or microvascular changes. These were reported as mild in Her case. These are tiny white spots, that occur with time and are seen in a variety of conditions, including with normal aging, chronic hypertension, chronic headaches, especially migraine HAs, chronic diabetes, chronic hyperlipidemia. These are not strokes and no mass or lesion or contrast enhancement was seen which is reassuring.  Again, there were no acute findings, such as a stroke, or mass or blood products.   MRI neck showed mild degenerative changes in the mid to lower neck, no significant nerve root compression causing pinched nerve type changes and no abnormal contrast uptake and no abnormalities in the spinal cord, overall fairly age-appropriate and reassuring findings.  No further action is required on these tests at this time, other than re-enforcing the importance of good blood pressure control, good cholesterol control, good blood sugar control, and weight management. Please remind patient to keep any upcoming appointments or tests and to call us with any interim questions, concerns, problems or updates. Thanks,  Star Age, MD, PhD

## 2017-01-01 NOTE — Telephone Encounter (Signed)
I called to discuss results of MRI head and neck. No answer. LVM for patient to call me back

## 2017-01-01 NOTE — Telephone Encounter (Signed)
-----   Message from Star Age, MD sent at 01/01/2017  1:30 PM EDT ----- Please call patient regarding the recent brain MRI: The brain scan showed a normal structure of the brain and no significant volume loss which we call atrophy. There were changes in the deeper structures of the brain, which we call white matter changes or microvascular changes. These were reported as mild in Her case. These are tiny white spots, that occur with time and are seen in a variety of conditions, including with normal aging, chronic hypertension, chronic headaches, especially migraine HAs, chronic diabetes, chronic hyperlipidemia. These are not strokes and no mass or lesion or contrast enhancement was seen which is reassuring.  Again, there were no acute findings, such as a stroke, or mass or blood products.   MRI neck showed mild degenerative changes in the mid to lower neck, no significant nerve root compression causing pinched nerve type changes and no abnormal contrast uptake and no abnormalities in the spinal cord, overall fairly age-appropriate and reassuring findings.  No further action is required on these tests at this time, other than re-enforcing the importance of good blood pressure control, good cholesterol control, good blood sugar control, and weight management. Please remind patient to keep any upcoming appointments or tests and to call us with any interim questions, concerns, problems or updates. Thanks,  Star Age, MD, PhD
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
## 2018-01-08 ENCOUNTER — Other Ambulatory Visit: Payer: Self-pay | Admitting: Internal Medicine

## 2018-01-08 DIAGNOSIS — Z1231 Encounter for screening mammogram for malignant neoplasm of breast: Secondary | ICD-10-CM

## 2018-01-10 ENCOUNTER — Encounter: Payer: Self-pay | Admitting: Physician Assistant

## 2018-02-01 ENCOUNTER — Ambulatory Visit
Admission: RE | Admit: 2018-02-01 | Discharge: 2018-02-01 | Disposition: A | Discharge status: Home / Self Care | Payer: BLUE CROSS/BLUE SHIELD | Source: Ambulatory Visit | Attending: Internal Medicine | Admitting: Internal Medicine

## 2018-02-01 DIAGNOSIS — Z1231 Encounter for screening mammogram for malignant neoplasm of breast: Secondary | ICD-10-CM

## 2018-02-04 ENCOUNTER — Other Ambulatory Visit (INDEPENDENT_AMBULATORY_CARE_PROVIDER_SITE_OTHER): Payer: BLUE CROSS/BLUE SHIELD

## 2018-02-04 ENCOUNTER — Encounter: Payer: Self-pay | Admitting: Physician Assistant

## 2018-02-04 ENCOUNTER — Encounter

## 2018-02-04 ENCOUNTER — Ambulatory Visit (INDEPENDENT_AMBULATORY_CARE_PROVIDER_SITE_OTHER): Payer: BLUE CROSS/BLUE SHIELD | Admitting: Physician Assistant

## 2018-02-04 VITALS — BP 116/80 | HR 80 | Ht 64.0 in | Wt 192.2 lb

## 2018-02-04 DIAGNOSIS — R197 Diarrhea, unspecified: Secondary | ICD-10-CM

## 2018-02-04 DIAGNOSIS — R110 Nausea: Secondary | ICD-10-CM | POA: Diagnosis not present

## 2018-02-04 DIAGNOSIS — R109 Unspecified abdominal pain: Secondary | ICD-10-CM | POA: Diagnosis not present

## 2018-02-04 DIAGNOSIS — Z800 Family history of malignant neoplasm of digestive organs: Secondary | ICD-10-CM

## 2018-02-04 LAB — SEDIMENTATION RATE: Sed Rate: 20 mm/hr (ref 0–30)

## 2018-02-04 LAB — HIGH SENSITIVITY CRP: CRP, High Sensitivity: 1.4 mg/L (ref 0.000–5.000)

## 2018-02-04 MED ORDER — PEG-KCL-NACL-NASULF-NA ASC-C 140 G PO SOLR: 1.0000 | Freq: Once | ORAL | 0 refills | Status: AC

## 2018-02-04 MED ORDER — DICYCLOMINE HCL 10 MG PO CAPS: ORAL_CAPSULE | ORAL | 1 refills | Status: DC

## 2018-02-04 NOTE — Progress Notes (Signed)
Subjective:    Patient ID: Melanie [name], female    DOB: 1955/12/08, 62 y.o.   MRN: [id number]  HPI Melanie [name] is a pleasant 63 year old white female, known to Dr. Henrene [name] from previous procedures and last seen in our office in 2013.  She is referred back today by Dr. Jani [name] for evaluation of persistent diarrhea. Patient has history of hypertension, GERD, osteoarthritis, depression/anxiety, fibromyalgia and is status post cholecystectomy.  She is status post Roux-en-Y gastric bypass remotely. She has family history of colon cancer in her mom. Colonoscopy was done in August 2013 and was a normal exam.  She also had EGD at that time which showed a normal-appearing esophagus and GE junction, and normal stomach status post gastric bypass. Patient says her current symptoms have been present over the past 6 to 7 weeks.  Prior to that her bowel movements had always been very regular.  She has been having liquid stools with usually 4-5 bowel movements per day.  She has not noticed any blood.  She does complain of some generalized griping or cramping kind of discomfort and nausea without vomiting.  She has not had any fever or chills. Is not been on any new medications supplements etc. no recent travel or other known infectious exposures.  She did take a Z-Pak about 2 months ago for respiratory infection.  Exline She says that she had labs done by Dr. Julianne [name] office which were unremarkable.  She has not had stool studies done but did a Hemoccult which was positive.  Review of Systems Pertinent positive and negative review of systems were noted in the above HPI section.  All other review of systems was otherwise negative.  Outpatient Encounter Medications as of 02/04/2018  Medication Sig  . Biotin 5000 MCG SUBL Place under the tongue 2 (two) times daily.  . Cholecalciferol (VITAMIN D3) 2000 units capsule Take 2,000 Units by mouth 3 (three) times daily.   . cyclobenzaprine (FLEXERIL) 5 MG tablet Take 5 mg by  mouth as needed for muscle spasms.  . ferrous sulfate 325 (65 FE) MG tablet Take 325 mg by mouth daily with breakfast.  . PARoxetine (PAXIL) 20 MG tablet Take 40 mg by mouth daily.  . promethazine (PHENERGAN) 25 MG tablet Take 12.5 mg by mouth as needed for nausea or vomiting.  . traZODone (DESYREL) 150 MG tablet Take 1 tablet (150 mg total) by mouth at bedtime.  . vitamin B-12 (CYANOCOBALAMIN) 1000 MCG tablet Take 1,500 mcg by mouth 2 (two) times daily.   Marland Kitchen dicyclomine (BENTYL) 10 MG capsule Take 1 tab by mouth 3 times daily as needed for cramping, diarrhea  . PEG-KCl-NaCl-NaSulf-Na Asc-C (PLENVU) 140 g SOLR Take 1 kit by mouth once for 1 dose. Take as directed for colonoscopy prep.   No facility-administered encounter medications on file as of 02/04/2018.    Allergies  Allergen Reactions  . Ace Inhibitors     REACTION: cough  . Penicillins Swelling  . Sulfa Antibiotics Nausea Only   Patient Active Problem List   Diagnosis Date Noted  . Severe major depression ([place]) 05/18/2015  . Memory impairment 05/18/2015  . Osteoarthritis of both knees 05/18/2015  . Healthcare maintenance 05/18/2015  . Family history of colon cancer 03/11/2012  . Fibromyalgia 10/03/2007  . Vitamin D deficiency 02/04/2007  . Vitamin B 12 deficiency 02/03/2007  . ANXIETY STATE NEC 02/03/2007  . BARRETT'S ESOPHAGUS 02/03/2007  . Essential hypertension 12/13/2006  . GERD 12/13/2006  . HIATAL HERNIA 12/13/2006  Social History   Socioeconomic History  . Marital status: Divorced    Spouse name: Not on file  . Number of children: 2  . Years of education: college  . Highest education level: Not on file  Occupational History  . Occupation: cma  Social Needs  . Financial resource strain: Not on file  . Food insecurity:    Worry: Not on file    Inability: Not on file  . Transportation needs:    Medical: Not on file    Non-medical: Not on file  Tobacco Use  . Smoking status: Former Smoker    Types:  Cigarettes    Last attempt to quit: 03/11/1993    Years since quitting: 24.9  . Smokeless tobacco: Never Used  Substance and Sexual Activity  . Alcohol use: Yes    Comment: rarely  . Drug use: No    Types: Marijuana    Comment: "years ago"  . Sexual activity: Not on file  Lifestyle  . Physical activity:    Days per week: Not on file    Minutes per session: Not on file  . Stress: Not on file  Relationships  . Social connections:    Talks on phone: Not on file    Gets together: Not on file    Attends religious service: Not on file    Active member of club or organization: Not on file    Attends meetings of clubs or organizations: Not on file    Relationship status: Not on file  . Intimate partner violence:    Fear of current or ex partner: Not on file    Emotionally abused: Not on file    Physically abused: Not on file    Forced sexual activity: Not on file  Other Topics Concern  . Not on file  Social History Narrative   Drinks 2-4 caffeine drinks a day     Melanie [name]'s family history includes Breast cancer in her paternal aunt; Colon cancer in her mother, paternal aunt, and unknown relative; Esophageal cancer in her father; Heart disease in her mother; Hypertension in her mother; Lung cancer in her paternal aunt; Skin cancer in her mother and unknown relative; Stroke in her mother.      Objective:    Vitals:   02/04/18 0918  BP: 116/80  Pulse: 80    Physical Exam; well-developed older white female in no acute distress, pleasant blood pressure 116/80 pulse 80, height 5 foot 4, weight 192, BMI 33.  HEENT; nontraumatic normocephalic EOMI PERRLA sclera anicteric, Oropharynx clear, Cardiovascular; regular rate and rhythm with S1-S2 no murmur rub or gallop, Pulmonary; clear bilaterally, Abdomen; soft, bowel sounds are present, she has a cholecystectomy scar no palpable mass or hepatosplenomegaly no focal tenderness, Rectal ;exam not done, Extremities; no clubbing cyanosis or  edema skin warm dry, Neuro psych; alert and oriented, grossly nonfocal mood and affect appropriate       Assessment & Plan:   #44 62 year old white female with 6 to 7-week history of persistent diarrhea associated with nausea and abdominal cramping. Etiology is not clear, need to rule out C. difficile, rule out other infectious etiology i.e. Giardia. Consider small intestinal bacterial overgrowth status post Roux-en-Y gastric bypass #2.  Status post remote gastric bypass #3.  GERD #4.  Family history of colon cancer-patient due for follow-up colonoscopy, last exam 5 years ago normal   Plan; Will check sed rate, CRP, GI path panel to include C. difficile PCR Start trial of Bentyl  10 mg p.o. 3 times daily as needed for cramping and diarrhea. Patient will be scheduled for colonoscopy with Dr. Henrene [name].  Will intentionally schedule this in September to allow time her current more acute issue to be sorted out and treated  Colonoscopy was discussed with patient in detail including indications risks and benefits and she is agreeable to proceed.   Jeliyah Middlebrooks Genia Harold PA-C 02/04/2018   Cc: Melanie Gravel, MD

## 2018-02-04 NOTE — Patient Instructions (Signed)
Your provider has requested that you go to the basement level for lab work and stool studies before leaving today. Press "B" on the elevator. The lab is located at the first door on the left as you exit the elevator. We sent prescriptions to [facility], [place].  1. Bentyl 10 mg 2. Plenvu colonoscopy prep  You have been scheduled for an endoscopy and colonoscopy. Please follow the written instructions given to you at your visit today. Please pick up your prep supplies at the pharmacy within the next 1-3 days. If you use inhalers (even only as needed), please bring them with you on the day of your procedure. Your physician has requested that you go to www.startemmi.com and enter the access code given to you at your visit today. This web site gives a general overview about your procedure. However, you should still follow specific instructions given to you by our office regarding your preparation for the procedure.

## 2018-02-04 NOTE — Progress Notes (Signed)
Assessment and plan reviewed 

## 2018-02-06 ENCOUNTER — Other Ambulatory Visit: Payer: BLUE CROSS/BLUE SHIELD

## 2018-02-06 DIAGNOSIS — Z800 Family history of malignant neoplasm of digestive organs: Secondary | ICD-10-CM

## 2018-02-06 DIAGNOSIS — R110 Nausea: Secondary | ICD-10-CM

## 2018-02-06 DIAGNOSIS — R197 Diarrhea, unspecified: Secondary | ICD-10-CM

## 2018-02-06 DIAGNOSIS — R109 Unspecified abdominal pain: Secondary | ICD-10-CM

## 2018-02-07 ENCOUNTER — Telehealth: Payer: Self-pay | Admitting: Internal Medicine

## 2018-02-07 NOTE — Telephone Encounter (Signed)
See result note.  

## 2018-02-08 ENCOUNTER — Telehealth: Payer: Self-pay | Admitting: Internal Medicine

## 2018-02-08 NOTE — Telephone Encounter (Signed)
Pt wanted to know if the GI pathogen panel was back. Discussed with her that those results are not back yet.

## 2018-02-08 NOTE — Telephone Encounter (Signed)
Pt has some questions about results of c-diff.

## 2018-02-11 LAB — GASTROINTESTINAL PATHOGEN PANEL PCR
C. difficile Tox A/B, PCR: NOT DETECTED
Campylobacter, PCR: NOT DETECTED
Cryptosporidium, PCR: NOT DETECTED
E coli (ETEC) LT/ST PCR: NOT DETECTED
E coli (STEC) stx1/stx2, PCR: NOT DETECTED
E coli 0157, PCR: NOT DETECTED
Giardia lamblia, PCR: NOT DETECTED
Norovirus, PCR: NOT DETECTED
Rotavirus A, PCR: NOT DETECTED
Salmonella, PCR: NOT DETECTED
Shigella, PCR: NOT DETECTED

## 2018-02-11 LAB — CLOSTRIDIUM DIFFICILE TOXIN B, QUALITATIVE, REAL-TIME PCR: Toxigenic C. Difficile by PCR: NOT DETECTED

## 2018-02-19 ENCOUNTER — Telehealth: Payer: Self-pay | Admitting: Internal Medicine

## 2018-02-19 NOTE — Telephone Encounter (Signed)
Left message for pt to call back  °

## 2018-02-22 NOTE — Telephone Encounter (Signed)
Pt did not return call. 

## 2018-02-25 ENCOUNTER — Telehealth: Payer: Self-pay | Admitting: Physician Assistant

## 2018-02-26 NOTE — Telephone Encounter (Signed)
Left message to call back  

## 2018-02-26 NOTE — Telephone Encounter (Signed)
GI pathogen panel and c-diff were negative. She is able to be more comfortable with Bentyl. She does continue to have a complaint of pain in her left side despite Bentyl. She is calling to ask if there is an earlier appointment for the colonoscopy with Dr Henrene Pastor.  Appointment moved to sooner opening.

## 2018-03-04 ENCOUNTER — Encounter: Payer: Self-pay | Admitting: Internal Medicine

## 2018-03-04 ENCOUNTER — Ambulatory Visit (AMBULATORY_SURGERY_CENTER): Payer: BLUE CROSS/BLUE SHIELD | Admitting: Internal Medicine

## 2018-03-04 VITALS — BP 152/87 | HR 54 | Temp 99.6°F | Resp 11 | Ht 64.0 in | Wt 192.0 lb

## 2018-03-04 DIAGNOSIS — Z800 Family history of malignant neoplasm of digestive organs: Secondary | ICD-10-CM

## 2018-03-04 DIAGNOSIS — R197 Diarrhea, unspecified: Secondary | ICD-10-CM

## 2018-03-04 DIAGNOSIS — Z1211 Encounter for screening for malignant neoplasm of colon: Secondary | ICD-10-CM | POA: Diagnosis not present

## 2018-03-04 MED ORDER — SODIUM CHLORIDE 0.9 % IV SOLN: 500.0000 mL | INTRAVENOUS | Status: DC

## 2018-03-04 NOTE — Patient Instructions (Signed)
YOU HAD AN ENDOSCOPIC PROCEDURE TODAY AT THE [facility]:   Refer to the procedure report that was given to you for any specific questions about what was found during the examination.  If the procedure report does not answer your questions, please call your gastroenterologist to clarify.  If you requested that your care partner not be given the details of your procedure findings, then the procedure report has been included in a sealed envelope for you to review at your convenience later.  YOU SHOULD EXPECT: Some feelings of bloating in the abdomen. Passage of more gas than usual.  Walking can help get rid of the air that was put into your GI tract during the procedure and reduce the bloating. If you had a lower endoscopy (such as a colonoscopy or flexible sigmoidoscopy) you may notice spotting of blood in your stool or on the toilet paper. If you underwent a bowel prep for your procedure, you may not have a normal bowel movement for a few days.  Please Note:  You might notice some irritation and congestion in your nose or some drainage.  This is from the oxygen used during your procedure.  There is no need for concern and it should clear up in a day or so.  SYMPTOMS TO REPORT IMMEDIATELY:   Following lower endoscopy (colonoscopy or flexible sigmoidoscopy):  Excessive amounts of blood in the stool  Significant tenderness or worsening of abdominal pains  Swelling of the abdomen that is new, acute  Fever of 100F or higher  For urgent or emergent issues, a gastroenterologist can be reached at any hour by calling [phone].   DIET:  We do recommend a small meal at first, but then you may proceed to your regular diet.  Drink plenty of fluids but you should avoid alcoholic beverages for 24 hours.  ACTIVITY:  You should plan to take it easy for the rest of today and you should NOT DRIVE or use heavy machinery until tomorrow (because of the sedation medicines used during the test).     FOLLOW UP: Our staff will call the number listed on your records the next business day following your procedure to check on you and address any questions or concerns that you may have regarding the information given to you following your procedure. If we do not reach you, we will leave a message.  However, if you are feeling well and you are not experiencing any problems, there is no need to return our call.  We will assume that you have returned to your regular daily activities without incident.  If any biopsies were taken you will be contacted by phone or by letter within the next 1-3 weeks.  Please call us at [phone] if you have not heard about the biopsies in 3 weeks.    SIGNATURES/CONFIDENTIALITY: You and/or your care partner have signed paperwork which will be entered into your electronic medical record.  These signatures attest to the fact that that the information above on your After Visit Summary has been reviewed and is understood.  Full responsibility of the confidentiality of this discharge information lies with you and/or your care-partner. 

## 2018-03-04 NOTE — Progress Notes (Signed)
A and O x3. Report to RN. Tolerated MAC anesthesia well.

## 2018-03-04 NOTE — Progress Notes (Signed)
Called to room to assist during endoscopic procedure.  Patient ID and intended procedure confirmed with present staff. Received instructions for my participation in the procedure from the performing physician.  

## 2018-03-04 NOTE — Op Note (Signed)
[place] Patient Name: Melanie [name] Procedure Date: 03/04/2018 2:16 PM MRN: [id number] Endoscopist: Docia Chuck. Henrene Pastor , MD Age: 62 Referring MD:  Date of Birth: September 16, 1955 Gender: Female Account #: [id number] Procedure:                Colonoscopy, With biopsies Indications:              Colon cancer screening in patient at increased                            risk: Colorectal cancer in mother (45s), Incidental                            diarrhea noted. Previous examinations 1998                            elsewhere and 2013 were negative Medicines:                Monitored Anesthesia Care Procedure:                Pre-Anesthesia Assessment:                           - Prior to the procedure, a History and Physical                            was performed, and patient medications and                            allergies were reviewed. The patient's tolerance of                            previous anesthesia was also reviewed. The risks                            and benefits of the procedure and the sedation                            options and risks were discussed with the patient.                            All questions were answered, and informed consent                            was obtained. Prior Anticoagulants: The patient has                            taken no previous anticoagulant or antiplatelet                            agents. ASA Grade Assessment: II - A patient with                            mild systemic disease. After reviewing the risks  and benefits, the patient was deemed in                            satisfactory condition to undergo the procedure.                           After obtaining informed consent, the colonoscope                            was passed under direct vision. Throughout the                            procedure, the patient's blood pressure, pulse, and                            oxygen saturations were  monitored continuously. The                            Colonoscope was introduced through the anus and                            advanced to the the cecum, identified by                            appendiceal orifice and ileocecal valve. The                            terminal ileum, ileocecal valve, appendiceal                            orifice, and rectum were photographed. The quality                            of the bowel preparation was excellent. The                            colonoscopy was performed without difficulty. The                            patient tolerated the procedure well. The bowel                            preparation used was SUPREP. Scope In: 2:30:15 PM Scope Out: 1:93:79 PM Scope Withdrawal Time: 0 hours 14 minutes 0 seconds  Total Procedure Duration: 0 hours 18 minutes 59 seconds  Findings:                 The terminal ileum appeared normal.                           The entire examined colon appeared normal on direct                            and retroflexion views. Biopsies for histology were  taken with a cold forceps from the entire colon for                            evaluation of microscopic colitis. Internal                            hemorrhoids present. Complications:            No immediate complications. Estimated blood loss:                            None. Estimated Blood Loss:     Estimated blood loss: none. Impression:               - The examined portion of the ileum was normal.                           - The entire examined colon is normal on direct and                            retroflexion views. Recommendation:           - Repeat colonoscopy in 5 years for screening                            purposes (Family history of colon cancer (.                           - Patient has a contact number available for                            emergencies. The signs and symptoms of potential                             delayed complications were discussed with the                            patient. Return to normal activities tomorrow.                            Written discharge instructions were provided to the                            patient.                           - Resume previous diet.                           - Continue present medications.                           - Await pathology results.                           - If biopsies negative for microscopic colitis then  consider a trial of Xifaxan for possible bacterial                            overgrowth Erlinda Solinger N. Henrene Pastor, MD 03/04/2018 2:55:54 PM This report has been signed electronically.

## 2018-03-05 ENCOUNTER — Telehealth: Payer: Self-pay | Admitting: *Deleted

## 2018-03-05 NOTE — Telephone Encounter (Signed)
  Follow up Call-  Call back number 03/04/2018  Post procedure Call Back phone  # [phone]  Permission to leave phone message Yes  Some recent data might be hidden     Patient questions:  Do you have a fever, pain , or abdominal swelling? No. Pain Score  0 *  Have you tolerated food without any problems? Yes.    Have you been able to return to your normal activities? Yes.    Do you have any questions about your discharge instructions: Diet   No. Medications  No. Follow up visit  No.  Do you have questions or concerns about your Care? No.  Actions: * If pain score is 4 or above: No action needed, pain <4.

## 2018-03-07 ENCOUNTER — Telehealth: Payer: Self-pay | Admitting: Internal Medicine

## 2018-03-07 NOTE — Telephone Encounter (Signed)
Patient aware.

## 2018-03-07 NOTE — Telephone Encounter (Signed)
Pt had colon last Monday, she stated that she still has small blood clots when she has a bm. She wants to kow if this is normal. Pls call her at her work [phone] and ask to page her.

## 2018-03-07 NOTE — Telephone Encounter (Signed)
I am not concerned. She just had simple biopsies. Any bleeding should be minor and self-limited. She also has internal hemorrhoids which may cause bleeding. Continue to monitor

## 2018-03-07 NOTE — Telephone Encounter (Signed)
Pt states since her colon on Monday she has been passing BRB when she has a bowel movement. She has noticed it on the tissue when she wipes and in the toilet water. Also reports she has been seeing what she thinks are small blood clots in the stool. Pt states one "clot" appeared very stringy and was about an inch long. Pt was concerned and wanted to know Dr. Blanch Media recommendations. Please advise.

## 2018-03-08 ENCOUNTER — Other Ambulatory Visit: Payer: Self-pay

## 2018-03-08 ENCOUNTER — Encounter: Payer: Self-pay | Admitting: Internal Medicine

## 2018-03-08 MED ORDER — RIFAXIMIN 550 MG PO TABS: 550.0000 mg | ORAL_TABLET | Freq: Three times a day (TID) | ORAL | 0 refills | Status: DC

## 2018-04-12 ENCOUNTER — Encounter: Payer: Self-pay | Admitting: Internal Medicine

## 2018-04-30 ENCOUNTER — Ambulatory Visit: Payer: Self-pay | Admitting: Internal Medicine

## 2018-10-21 ENCOUNTER — Emergency Department (HOSPITAL_COMMUNITY)
Admission: EM | Admit: 2018-10-21 | Discharge: 2018-10-21 | Disposition: A | Discharge status: Home / Self Care | Payer: BLUE CROSS/BLUE SHIELD | Attending: Emergency Medicine | Admitting: Emergency Medicine

## 2018-10-21 ENCOUNTER — Encounter (HOSPITAL_COMMUNITY): Payer: Self-pay | Admitting: Emergency Medicine

## 2018-10-21 ENCOUNTER — Other Ambulatory Visit: Payer: Self-pay

## 2018-10-21 DIAGNOSIS — R197 Diarrhea, unspecified: Secondary | ICD-10-CM | POA: Insufficient documentation

## 2018-10-21 DIAGNOSIS — R112 Nausea with vomiting, unspecified: Secondary | ICD-10-CM | POA: Diagnosis present

## 2018-10-21 DIAGNOSIS — Z87891 Personal history of nicotine dependence: Secondary | ICD-10-CM | POA: Diagnosis not present

## 2018-10-21 DIAGNOSIS — Z96652 Presence of left artificial knee joint: Secondary | ICD-10-CM | POA: Insufficient documentation

## 2018-10-21 DIAGNOSIS — J069 Acute upper respiratory infection, unspecified: Secondary | ICD-10-CM | POA: Diagnosis not present

## 2018-10-21 DIAGNOSIS — Z79899 Other long term (current) drug therapy: Secondary | ICD-10-CM | POA: Diagnosis not present

## 2018-10-21 DIAGNOSIS — R110 Nausea: Secondary | ICD-10-CM

## 2018-10-21 DIAGNOSIS — I10 Essential (primary) hypertension: Secondary | ICD-10-CM | POA: Diagnosis not present

## 2018-10-21 MED ORDER — ONDANSETRON 4 MG PO TBDP: 4.0000 mg | ORAL_TABLET | Freq: Three times a day (TID) | ORAL | 0 refills | Status: DC | PRN

## 2018-10-21 MED ORDER — ONDANSETRON 4 MG PO TBDP
4.0000 mg | ORAL_TABLET | Freq: Once | ORAL | Status: AC
  Administered 2018-10-21: 12:00:00 4 mg via ORAL
  Filled 2018-10-21: qty 1

## 2018-10-21 NOTE — ED Triage Notes (Signed)
Patient was exposed to patient on Tuesday, who was recently tested positive for COVID-19, started having weakness, fever, N/V/D on Friday, highest temp 101.1 F at home took Tylenol.

## 2018-10-21 NOTE — ED Provider Notes (Signed)
[facility] EMERGENCY DEPARTMENT Provider Note   CSN: [id number] Arrival date & time: 10/21/18  1941    History   Chief Complaint Chief Complaint  Patient presents with  . Nausea  . Emesis  . Diarrhea  . Fever    HPI Melanie [name] is a 63 y.o. female.     The history is provided by the patient. No language interpreter was used.  Emesis  Severity:  Moderate Duration:  3 days Timing:  Constant Progression:  Worsening Chronicity:  New Relieved by:  Nothing Worsened by:  Nothing Ineffective treatments:  None tried Associated symptoms: fever   Associated symptoms: no diarrhea   Risk factors: no sick contacts   Diarrhea  Associated symptoms: fever and vomiting   Fever  Associated symptoms: vomiting   Associated symptoms: no diarrhea   Pt reports she was exposed to Covid 19 at her job.  Pt reports she has had a fever for 3 days.  Pt reports her job advised her to come in for testing.   Past Medical History:  Diagnosis Date  . Arthritis   . Barrett's esophagus   . Depression   . Fibromyalgia   . Gallstones   . Hypertension   . Obesity   . Pernicious anemia     Patient Active Problem List   Diagnosis Date Noted  . Severe major depression ([place]) 05/18/2015  . Memory impairment 05/18/2015  . Osteoarthritis of both knees 05/18/2015  . Healthcare maintenance 05/18/2015  . Family history of colon cancer 03/11/2012  . Fibromyalgia 10/03/2007  . Vitamin D deficiency 02/04/2007  . Vitamin B 12 deficiency 02/03/2007  . ANXIETY STATE NEC 02/03/2007  . BARRETT'S ESOPHAGUS 02/03/2007  . Essential hypertension 12/13/2006  . GERD 12/13/2006  . HIATAL HERNIA 12/13/2006    Past Surgical History:  Procedure Laterality Date  . APPENDECTOMY    . BARIATRIC SURGERY    . BREAST CYST EXCISION Right    x 2  . CESAREAN SECTION     x 2  . CHOLECYSTECTOMY    . ovaries removed    . ROTATOR CUFF REPAIR Left   . TONSILLECTOMY    . TOTAL KNEE ARTHROPLASTY Left   . VAGINAL  HYSTERECTOMY       OB History   No obstetric history on file.      Home Medications    Prior to Admission medications   Medication Sig Start Date End Date Taking? Authorizing Provider  Biotin 5000 MCG SUBL Place under the tongue 2 (two) times daily.    [provider]  busPIRone (BUSPAR) 5 MG tablet Take 1 tablet by mouth 2 (two) times daily. 08/24/18   [provider]  Cholecalciferol (VITAMIN D3) 2000 units capsule Take 2,000 Units by mouth 3 (three) times daily.     [provider]  cyclobenzaprine (FLEXERIL) 10 MG tablet Take 1 tablet by mouth 2 (two) times daily as needed. 04/25/18   [provider]  dicyclomine (BENTYL) 10 MG capsule Take 1 tab by mouth 3 times daily as needed for cramping, diarrhea 02/04/18   Esterwood, Amy S, PA-C  ferrous sulfate 325 (65 FE) MG tablet Take 325 mg by mouth daily with breakfast.    [provider]  ondansetron (ZOFRAN ODT) 4 MG disintegrating tablet Take 1 tablet (4 mg total) by mouth every 8 (eight) hours as needed for nausea or vomiting. 10/21/18   Fransico Meadow, PA-C  PARoxetine (PAXIL) 20 MG tablet Take 40 mg by mouth  daily.    [provider]  promethazine (PHENERGAN) 25 MG tablet Take 12.5 mg by mouth as needed for nausea or vomiting.    [provider]  rifaximin (XIFAXAN) 550 MG TABS tablet Take 1 tablet (550 mg total) by mouth 3 (three) times daily. 03/08/18   Irene Shipper, MD  traZODone (DESYREL) 150 MG tablet Take 1 tablet (150 mg total) by mouth at bedtime. 08/04/15   Burns, Arloa Koh, MD  vitamin B-12 (CYANOCOBALAMIN) 1000 MCG tablet Take 1,500 mcg by mouth 2 (two) times daily.     [provider]    Family History Family History  Problem Relation Age of Onset  . Colon cancer Mother   . Heart disease Mother   . Hypertension Mother   . Stroke Mother   . Skin cancer Mother   . Colon cancer Paternal Aunt   . Breast cancer Paternal Aunt   . Lung cancer Paternal Aunt    . Colon cancer Other        mat. cousin  . Skin cancer Other        mother whole fam  . Esophageal cancer Father     Social History Social History   Tobacco Use  . Smoking status: Former Smoker    Types: Cigarettes    Last attempt to quit: 03/11/1993    Years since quitting: 25.6  . Smokeless tobacco: Never Used  Substance Use Topics  . Alcohol use: Yes    Comment: rarely  . Drug use: No    Comment: "years ago"     Allergies   Ace inhibitors; Penicillins; and Sulfa antibiotics   Review of Systems Review of Systems  Constitutional: Positive for fever.  Gastrointestinal: Positive for vomiting. Negative for diarrhea.  All other systems reviewed and are negative.    Physical Exam Updated Vital Signs BP 126/71   Pulse 71   Temp 98.4 F (36.9 C) (Oral)   Resp 12   Ht 5\' 5"  (1.651 m)   Wt 86.2 kg   SpO2 96%   BMI 31.62 kg/m   Physical Exam Vitals signs and nursing note reviewed.  Constitutional:      Appearance: She is well-developed.  HENT:     Head: Normocephalic.     Nose: Nose normal.  Eyes:     Pupils: Pupils are equal, round, and reactive to light.  Neck:     Musculoskeletal: Normal range of motion.  Cardiovascular:     Rate and Rhythm: Normal rate.     Pulses: Normal pulses.  Pulmonary:     Effort: Pulmonary effort is normal.  Abdominal:     General: Abdomen is flat. There is no distension.  Musculoskeletal: Normal range of motion.  Skin:    General: Skin is warm.  Neurological:     Mental Status: She is alert and oriented to person, place, and time.  Psychiatric:        Mood and Affect: Mood normal.      ED Treatments / Results  Labs (all labs ordered are listed, but only abnormal results are displayed) Labs Reviewed - No data to display  EKG None  Radiology No results found.  Procedures Procedures (including critical care time)  Medications Ordered in ED Medications  ondansetron (ZOFRAN-ODT) disintegrating tablet 4 mg  (4 mg Oral Given 10/21/18 1131)     Initial Impression / Assessment and Plan / ED Course  I have reviewed the triage vital signs and the nursing notes.  Pertinent  labs & imaging results that were available during my care of the patient were reviewed by me and considered in my medical decision making (see chart for details).        MDM  Pt counseled.  She may have Covid !9.  Pt does not have a fever.  Pt is not short of breath.  Pt does not need inpatient treatment.  Pt does not meet criteria for testing.  Pt agreed to home quarantine.  Pt given information on Covid.  Pt will return if any problems.   Final Clinical Impressions(s) / ED Diagnoses   Final diagnoses:  Nausea  Upper respiratory tract infection, unspecified type    ED Discharge Orders         Ordered    ondansetron (ZOFRAN ODT) 4 MG disintegrating tablet  Every 8 hours PRN     10/21/18 1106        An After Visit Summary was printed and given to the patient.    Fransico Meadow, [place] 10/21/18 1513    Nat Christen, MD 10/22/18 1331

## 2018-10-21 NOTE — Discharge Instructions (Signed)
Person Under Monitoring Name: Melanie [name]  Location: [place] [address]   Infection Prevention Recommendations for Individuals Confirmed to have, or Being Evaluated for, 2019 Novel Coronavirus (COVID-19) Infection Who Receive Care at Home  Individuals who are confirmed to have, or are being evaluated for, COVID-19 should follow the prevention steps below until a healthcare provider or local or state health department says they can return to normal activities.  Stay home except to get medical care You should restrict activities outside your home, except for getting medical care. Do not go to work, school, or public areas, and do not use public transportation or taxis.  Call ahead before visiting your doctor Before your medical appointment, call the healthcare provider and tell them that you have, or are being evaluated for, COVID-19 infection. This will help the healthcare providers office take steps to keep other people from getting infected. Ask your healthcare provider to call the local or state health department.  Monitor your symptoms Seek prompt medical attention if your illness is worsening (e.g., difficulty breathing). Before going to your medical appointment, call the healthcare provider and tell them that you have, or are being evaluated for, COVID-19 infection. Ask your healthcare provider to call the local or state health department.  Wear a facemask You should wear a facemask that covers your nose and mouth when you are in the same room with other people and when you visit a healthcare provider. People who live with or visit you should also wear a facemask while they are in the same room with you.  Separate yourself from other people in your home As much as possible, you should stay in a different room from other people in your home. Also, you should use a separate bathroom, if available.  Avoid sharing household items You should not share  dishes, drinking glasses, cups, eating utensils, towels, bedding, or other items with other people in your home. After using these items, you should wash them thoroughly with soap and water.  Cover your coughs and sneezes Cover your mouth and nose with a tissue when you cough or sneeze, or you can cough or sneeze into your sleeve. Throw used tissues in a lined trash can, and immediately wash your hands with soap and water for at least 20 seconds or use an alcohol-based hand rub.  Wash your [facility] your hands often and thoroughly with soap and water for at least 20 seconds. You can use an alcohol-based hand sanitizer if soap and water are not available and if your hands are not visibly dirty. Avoid touching your eyes, nose, and mouth with unwashed hands.   Prevention Steps for Caregivers and Household Members of Individuals Confirmed to have, or Being Evaluated for, COVID-19 Infection Being Cared for in the Home  If you live with, or provide care at home for, a person confirmed to have, or being evaluated for, COVID-19 infection please follow these guidelines to prevent infection:  Follow healthcare providers instructions Make sure that you understand and can help the patient follow any healthcare provider instructions for all care.  Provide for the patients basic needs You should help the patient with basic needs in the home and provide support for getting groceries, prescriptions, and other personal needs.  Monitor the patients symptoms If they are getting sicker, call his or her medical provider and tell them that the patient has, or is being evaluated for, COVID-19 infection. This will help the healthcare providers office  take steps to keep other people from getting infected. °Ask the healthcare provider to call the local or state health department. ° °Limit the number of people who have contact with the patient °If possible, have only one caregiver for the patient. °Other  household members should stay in another home or place of residence. If this is not possible, they should stay °in another room, or be separated from the patient as much as possible. Use a separate bathroom, if available. °Restrict visitors who do not have an essential need to be in the home. ° °Keep older adults, very young children, and other sick people away from the patient °Keep older adults, very young children, and those who have compromised immune systems or chronic health conditions away from the patient. This includes people with chronic heart, lung, or kidney conditions, diabetes, and cancer. ° °Ensure good ventilation °Make sure that shared spaces in the home have good air flow, such as from an air conditioner or an opened window, °weather permitting. ° °Wash your hands often °Wash your hands often and thoroughly with soap and water for at least 20 seconds. You can use an alcohol based hand sanitizer if soap and water are not available and if your hands are not visibly dirty. °Avoid touching your eyes, nose, and mouth with unwashed hands. °Use disposable paper towels to dry your hands. If not available, use dedicated cloth towels and replace them when they become wet. ° °Wear a facemask and gloves °Wear a disposable facemask at all times in the room and gloves when you touch or have contact with the patient’s blood, body fluids, and/or secretions or excretions, such as sweat, saliva, sputum, nasal mucus, vomit, urine, or feces.  Ensure the mask fits over your nose and mouth tightly, and do not touch it during use. °Throw out disposable facemasks and gloves after using them. Do not reuse. °Wash your hands immediately after removing your facemask and gloves. °If your personal clothing becomes contaminated, carefully remove clothing and launder. Wash your hands after handling contaminated clothing. °Place all used disposable facemasks, gloves, and other waste in a lined container before disposing them with  other household waste. °Remove gloves and wash your hands immediately after handling these items. ° °Do not share dishes, glasses, or other household items with the patient °Avoid sharing household items. You should not share dishes, drinking glasses, cups, eating utensils, towels, bedding, or other items with a patient who is confirmed to have, or being evaluated for, COVID-19 infection. °After the person uses these items, you should wash them thoroughly with soap and water. ° °Wash laundry thoroughly °Immediately remove and wash clothes or bedding that have blood, body fluids, and/or secretions or excretions, such as sweat, saliva, sputum, nasal mucus, vomit, urine, or feces, on them. °Wear gloves when handling laundry from the patient. °Read and follow directions on labels of laundry or clothing items and detergent. In general, wash and dry with the warmest temperatures recommended on the label. ° °Clean all areas the individual has used often °Clean all touchable surfaces, such as counters, tabletops, doorknobs, bathroom fixtures, toilets, phones, keyboards, tablets, and bedside tables, every day. Also, clean any surfaces that may have blood, body fluids, and/or secretions or excretions on them. °Wear gloves when cleaning surfaces the patient has come in contact with. °Use a diluted bleach solution (e.g., dilute bleach with 1 part bleach and 10 parts water) or a household disinfectant with a label that says EPA-registered for coronaviruses. To make a bleach   solution at home, add 1 tablespoon of bleach to 1 quart (4 cups) of water. For a larger supply, add  cup of bleach to 1 gallon (16 cups) of water. Read labels of cleaning products and follow recommendations provided on product labels. Labels contain instructions for safe and effective use of the cleaning product including precautions you should take when applying the product, such as wearing gloves or eye protection and making sure you have good ventilation  during use of the product. Remove gloves and wash hands immediately after cleaning.  Monitor yourself for signs and symptoms of illness Caregivers and household members are considered close contacts, should monitor their health, and will be asked to limit movement outside of the home to the extent possible. Follow the monitoring steps for close contacts listed on the symptom monitoring form.   ? If you have additional questions, contact your local health department or call the epidemiologist on call at [phone] (available 24/7). ? This guidance is subject to change. For the most up-to-date guidance from [facility], please refer to their website: [email or web address]

## 2019-03-28 NOTE — Patient Instructions (Addendum)
DUE TO COVID-19 ONLY ONE VISITOR IS ALLOWED TO COME WITH YOU AND STAY IN THE WAITING ROOM ONLY DURING PRE OP AND PROCEDURE DAY OF SURGERY. THE 1 VISITOR MAY VISIT WITH YOU AFTER SURGERY IN YOUR PRIVATE ROOM DURING VISITING HOURS ONLY!  YOU NEED TO HAVE A COVID 19 TEST ON___THURSDAY 9-17-2020____ @___11 :00am____, THIS TEST MUST BE DONE BEFORE SURGERY, COME  [address] , [id number].  ([place]) ONCE YOUR COVID TEST IS COMPLETED, PLEASE BEGIN THE QUARANTINE INSTRUCTIONS AS OUTLINED IN YOUR HANDOUT.                Melanie [name]   Your procedure is scheduled on: 04-07-2019   Report to [facility] Main  Entrance   Report to admitting at 10:10AM     Call this number if you have problems the morning of surgery [place], NO CHEWING GUM [place].    Do not eat food After Midnight. YOU MAY HAVE CLEAR LIQUIDS FROM MIDNIGHT UNTIL 9:40AM. At 9:40AM Please finish the prescribed Pre-Surgery ENSURE drink. Nothing by mouth after you finish the ENSURE drink !   CLEAR LIQUID DIET   Foods Allowed                                                                     Foods Excluded  Coffee and tea, regular and decaf                             liquids that you cannot  Plain Jell-O any favor except red or purple                                           see through such as: Fruit ices (not with fruit pulp)                                     milk, soups, orange juice  Iced Popsicles                                    All solid food Carbonated beverages, regular and diet                                    Cranberry, grape and apple juices Sports drinks like Gatorade Lightly seasoned clear broth or consume(fat free) Sugar, honey syrup  Sample Menu Breakfast                                Lunch  Supper Cranberry juice                    Beef broth                             Chicken broth Jell-O                                     Grape juice                           Apple juice Coffee or tea                        Jell-O                                      Popsicle                                                Coffee or tea                        Coffee or tea  _____________________________________________________________________     Take these medicines the morning of surgery with A SIP OF WATER:                                 You may not have any metal on your body including hair pins and              piercings  Do not wear jewelry, make-up, lotions, powders or perfumes, deodorant             Do not wear nail polish.  Do not shave  48 hours prior to surgery.     Do not bring valuables to the hospital. [place].  Contacts, dentures or bridgework may not be worn into surgery.                Please read over the following fact sheets you were given: _____________________________________________________________________             [facility] - Preparing for Surgery Before surgery, you can play an important role.  Because skin is not sterile, your skin needs to be as free of germs as possible.  You can reduce the number of germs on your skin by washing with CHG (chlorahexidine gluconate) soap before surgery.  CHG is an antiseptic cleaner which kills germs and bonds with the skin to continue killing germs even after washing. Please DO NOT use if you have an allergy to CHG or antibacterial soaps.  If your skin becomes reddened/irritated stop using the CHG and inform your nurse when you arrive at Short Stay. Do not shave (including legs and underarms) for at least 48 hours prior to the first CHG shower.  You may shave your face/neck. Please follow these instructions carefully:  1.  Shower with CHG Soap  the night before surgery and the  morning of Surgery.  2.  If you choose to wash  your hair, wash your hair first as usual with your  normal  shampoo.  3.  After you shampoo, rinse your hair and body thoroughly to remove the  shampoo.                           4.  Use CHG as you would any other liquid soap.  You can apply chg directly  to the skin and wash                       Gently with a scrungie or clean washcloth.  5.  Apply the CHG Soap to your body ONLY FROM THE NECK DOWN.   Do not use on face/ open                           Wound or open sores. Avoid contact with eyes, ears mouth and genitals (private parts).                       Wash face,  Genitals (private parts) with your normal soap.             6.  Wash thoroughly, paying special attention to the area where your surgery  will be performed.  7.  Thoroughly rinse your body with warm water from the neck down.  8.  DO NOT shower/wash with your normal soap after using and rinsing off  the CHG Soap.                9.  Pat yourself dry with a clean towel.            10.  Wear clean pajamas.            11.  Place clean sheets on your bed the night of your first shower and do not  sleep with pets. Day of Surgery : Do not apply any lotions/deodorants the morning of surgery.  Please wear clean clothes to the hospital/surgery center.  FAILURE TO FOLLOW THESE INSTRUCTIONS MAY RESULT IN THE CANCELLATION OF YOUR SURGERY PATIENT SIGNATURE_________________________________  NURSE SIGNATURE__________________________________  ________________________________________________________________________   Melanie [name]  An incentive spirometer is a tool that can help keep your lungs clear and active. This tool measures how well you are filling your lungs with each breath. Taking long deep breaths may help reverse or decrease the chance of developing breathing (pulmonary) problems (especially infection) following:  A long period of time when you are unable to move or be active. BEFORE THE PROCEDURE   If the spirometer  includes an indicator to show your best effort, your nurse or respiratory therapist will set it to a desired goal.  If possible, sit up straight or lean slightly forward. Try not to slouch.  Hold the incentive spirometer in an upright position. INSTRUCTIONS FOR USE  1. Sit on the edge of your bed if possible, or sit up as far as you can in bed or on a chair. 2. Hold the incentive spirometer in an upright position. 3. Breathe out normally. 4. Place the mouthpiece in your mouth and seal your lips tightly around it. 5. Breathe in slowly and as deeply as possible, raising the piston or the ball toward the top of the column. 6. Hold your breath for  3-5 seconds or for as long as possible. Allow the piston or ball to fall to the bottom of the column. 7. Remove the mouthpiece from your mouth and breathe out normally. 8. Rest for a few seconds and repeat Steps 1 through 7 at least 10 times every 1-2 hours when you are awake. Take your time and take a few normal breaths between deep breaths. 9. The spirometer may include an indicator to show your best effort. Use the indicator as a goal to work toward during each repetition. 10. After each set of 10 deep breaths, practice coughing to be sure your lungs are clear. If you have an incision (the cut made at the time of surgery), support your incision when coughing by placing a pillow or rolled up towels firmly against it. Once you are able to get out of bed, walk around indoors and cough well. You may stop using the incentive spirometer when instructed by your caregiver.  RISKS AND COMPLICATIONS  Take your time so you do not get dizzy or light-headed.  If you are in pain, you may need to take or ask for pain medication before doing incentive spirometry. It is harder to take a deep breath if you are having pain. AFTER USE  Rest and breathe slowly and easily.  It can be helpful to keep track of a log of your progress. Your caregiver can provide you with a  simple table to help with this. If you are using the spirometer at home, follow these instructions: [place] IF:   You are having difficultly using the spirometer.  You have trouble using the spirometer as often as instructed.  Your pain medication is not giving enough relief while using the spirometer.  You develop fever of 100.5 F (38.1 C) or higher. SEEK IMMEDIATE MEDICAL CARE IF:   You cough up bloody sputum that had not been present before.  You develop fever of 102 F (38.9 C) or greater.  You develop worsening pain at or near the incision site. MAKE SURE YOU:   Understand these instructions.  Will watch your condition.  Will get help right away if you are not doing well or get worse. Document Released: 11/13/2006 Document Revised: 09/25/2011 Document Reviewed: 01/14/2007 Exit[employer] Patient Information 2014 [employer], [place].   ________________________________________________________________________  WHAT IS A BLOOD TRANSFUSION? Blood Transfusion Information  A transfusion is the replacement of blood or some of its parts. Blood is made up of multiple cells which provide different functions.  Red blood cells carry oxygen and are used for blood loss replacement.  White blood cells fight against infection.  Platelets control bleeding.  Plasma helps clot blood.  Other blood products are available for specialized needs, such as hemophilia or other clotting disorders. BEFORE THE TRANSFUSION  Who gives blood for transfusions?   Healthy volunteers who are fully evaluated to make sure their blood is safe. This is blood bank blood. Transfusion therapy is the safest it has ever been in the practice of medicine. Before blood is taken from a donor, a complete history is taken to make sure that person has no history of diseases nor engages in risky social behavior (examples are intravenous drug use or sexual activity with multiple partners). The donor's travel history  is screened to minimize risk of transmitting infections, such as malaria. The donated blood is tested for signs of infectious diseases, such as HIV and hepatitis. The blood is then tested to be sure it is compatible with you in  order to minimize the chance of a transfusion reaction. If you or a relative donates blood, this is often done in anticipation of surgery and is not appropriate for emergency situations. It takes many days to process the donated blood. RISKS AND COMPLICATIONS Although transfusion therapy is very safe and saves many lives, the main dangers of transfusion include:   Getting an infectious disease.  Developing a transfusion reaction. This is an allergic reaction to something in the blood you were given. Every precaution is taken to prevent this. The decision to have a blood transfusion has been considered carefully by your caregiver before blood is given. Blood is not given unless the benefits outweigh the risks. AFTER THE TRANSFUSION  Right after receiving a blood transfusion, you will usually feel much better and more energetic. This is especially true if your red blood cells have gotten low (anemic). The transfusion raises the level of the red blood cells which carry oxygen, and this usually causes an energy increase.  The nurse administering the transfusion will monitor you carefully for complications. HOME CARE INSTRUCTIONS  No special instructions are needed after a transfusion. You may find your energy is better. Speak with your caregiver about any limitations on activity for underlying diseases you may have. SEEK MEDICAL CARE IF:   Your condition is not improving after your transfusion.  You develop redness or irritation at the intravenous (IV) site. SEEK IMMEDIATE MEDICAL CARE IF:  Any of the following symptoms occur over the next 12 hours:  Shaking chills.  You have a temperature by mouth above 102 F (38.9 C), not controlled by medicine.  Chest, back, or  muscle pain.  People around you feel you are not acting correctly or are confused.  Shortness of breath or difficulty breathing.  Dizziness and fainting.  You get a rash or develop hives.  You have a decrease in urine output.  Your urine turns a dark color or changes to pink, red, or brown. Any of the following symptoms occur over the next 10 days:  You have a temperature by mouth above 102 F (38.9 C), not controlled by medicine.  Shortness of breath.  Weakness after normal activity.  The white part of the eye turns yellow (jaundice).  You have a decrease in the amount of urine or are urinating less often.  Your urine turns a dark color or changes to pink, red, or brown. Document Released: 06/30/2000 Document Revised: 09/25/2011 Document Reviewed: 02/17/2008 [facility] Patient Information 2014 [place], [place].  _______________________________________________________________________

## 2019-03-31 ENCOUNTER — Other Ambulatory Visit: Payer: Self-pay

## 2019-03-31 ENCOUNTER — Encounter (HOSPITAL_COMMUNITY)
Admission: RE | Admit: 2019-03-31 | Discharge: 2019-03-31 | Disposition: A | Discharge status: Home / Self Care | Payer: BC Managed Care – PPO | Source: Ambulatory Visit | Attending: Orthopedic Surgery | Admitting: Orthopedic Surgery

## 2019-03-31 ENCOUNTER — Encounter (HOSPITAL_COMMUNITY): Payer: Self-pay

## 2019-03-31 DIAGNOSIS — I10 Essential (primary) hypertension: Secondary | ICD-10-CM | POA: Insufficient documentation

## 2019-03-31 DIAGNOSIS — M1711 Unilateral primary osteoarthritis, right knee: Secondary | ICD-10-CM | POA: Diagnosis not present

## 2019-03-31 DIAGNOSIS — I498 Other specified cardiac arrhythmias: Secondary | ICD-10-CM | POA: Insufficient documentation

## 2019-03-31 DIAGNOSIS — Z01818 Encounter for other preprocedural examination: Secondary | ICD-10-CM | POA: Insufficient documentation

## 2019-03-31 HISTORY — DX: Nausea with vomiting, unspecified: R11.2

## 2019-03-31 HISTORY — DX: Other specified postprocedural states: Z98.890

## 2019-03-31 LAB — ABO/RH: ABO/RH(D): O POS

## 2019-03-31 LAB — COMPREHENSIVE METABOLIC PANEL
ALT: 30 U/L (ref 0–44)
AST: 28 U/L (ref 15–41)
Albumin: 3.6 g/dL (ref 3.5–5.0)
Alkaline Phosphatase: 115 U/L (ref 38–126)
Anion gap: 6 (ref 5–15)
BUN: 21 mg/dL (ref 8–23)
CO2: 26 mmol/L (ref 22–32)
Calcium: 8.7 mg/dL — ABNORMAL LOW (ref 8.9–10.3)
Chloride: 110 mmol/L (ref 98–111)
Creatinine, Ser: 0.73 mg/dL (ref 0.44–1.00)
GFR calc Af Amer: >60 mL/min (ref >60)
GFR calc non Af Amer: >60 mL/min (ref >60)
Glucose, Bld: 84 mg/dL (ref 70–99)
Potassium: 4 mmol/L (ref 3.5–5.1)
Sodium: 142 mmol/L (ref 135–145)
Total Bilirubin: 0.6 mg/dL (ref 0.3–1.2)
Total Protein: 6.6 g/dL (ref 6.5–8.1)

## 2019-03-31 LAB — CBC
HCT: 45.9 % (ref 36.0–46.0)
Hemoglobin: 14.2 g/dL (ref 12.0–15.0)
MCH: 28.9 pg (ref 26.0–34.0)
MCHC: 30.9 g/dL (ref 30.0–36.0)
MCV: 93.5 fL (ref 80.0–100.0)
Platelets: 251 10*3/uL (ref 150–400)
RBC: 4.91 MIL/uL (ref 3.87–5.11)
RDW: 12.7 % (ref 11.5–15.5)
WBC: 7.4 10*3/uL (ref 4.0–10.5)
nRBC: 0 % (ref 0.0–0.2)

## 2019-03-31 LAB — PROTIME-INR
INR: 1 (ref 0.8–1.2)
Prothrombin Time: 13.1 seconds (ref 11.4–15.2)

## 2019-03-31 LAB — APTT: aPTT: 28 seconds (ref 24–36)

## 2019-03-31 LAB — SURGICAL PCR SCREEN
MRSA, PCR: NEGATIVE
Staphylococcus aureus: NEGATIVE

## 2019-03-31 NOTE — H&P (Signed)
TOTAL KNEE ADMISSION H&P  Patient is being admitted for right total knee arthroplasty.  Subjective:  Chief Complaint:right knee pain.  HPI: Melanie [name], 63 y.o. female, has a history of pain and functional disability in the right knee due to arthritis and has failed non-surgical conservative treatments for greater than 12 weeks to includecorticosteriod injections, use of assistive devices and activity modification.  Onset of symptoms was gradual, starting 2 years ago with gradually worsening course since that time. The patient noted prior procedures on the knee to include  menisectomy on the right knee(s).  Patient currently rates pain in the right knee(s) at 8 out of 10 with activity. Patient has worsening of pain with activity and weight bearing, crepitus and instability.  Patient has evidence of  bone-on-bone arthritis in the medial and patellofemoral compartments with large osteophyte formation by imaging studies. There is no active infection.  Patient Active Problem List   Diagnosis Date Noted  . Severe major depression ([place]) 05/18/2015  . Memory impairment 05/18/2015  . Osteoarthritis of both knees 05/18/2015  . Healthcare maintenance 05/18/2015  . Family history of colon cancer 03/11/2012  . Fibromyalgia 10/03/2007  . Vitamin D deficiency 02/04/2007  . Vitamin B 12 deficiency 02/03/2007  . ANXIETY STATE NEC 02/03/2007  . BARRETT'S ESOPHAGUS 02/03/2007  . Essential hypertension 12/13/2006  . GERD 12/13/2006  . HIATAL HERNIA 12/13/2006   Past Medical History:  Diagnosis Date  . Arthritis   . Barrett's esophagus    RESOLVED AS PER LAST ENDOSCOPY   . Depression   . Fibromyalgia   . Gallstones    RESOLVED WITH REMOVAL OF GALLBLADDER   . Hypertension    DX WHEN SHE WEIGHED OVER 300LB , LOST WEIGHT WITH AID OF BARIATRIC SURGERY , NOW  NO LONGER TAKES BP MEDICATION   . Obesity   . Pernicious anemia    RESOLVED   . PONV (postoperative nausea and vomiting)     Past Surgical  History:  Procedure Laterality Date  . APPENDECTOMY    . BARIATRIC SURGERY    . BREAST CYST EXCISION Right    x 2  . CESAREAN SECTION     x 2  . CHOLECYSTECTOMY    . ovaries removed    . ROTATOR CUFF REPAIR Left   . TONSILLECTOMY    . TOTAL KNEE ARTHROPLASTY Left   . VAGINAL HYSTERECTOMY      No current facility-administered medications for this encounter.    Current Outpatient Medications  Medication Sig Dispense Refill Last Dose  . Biotin 5000 MCG SUBL Place under the tongue 2 (two) times daily.     . busPIRone (BUSPAR) 5 MG tablet Take 1 tablet by mouth 2 (two) times daily.     . Cholecalciferol (VITAMIN D3) 2000 units capsule Take 2,000 Units by mouth 3 (three) times daily.      . cyclobenzaprine (FLEXERIL) 10 MG tablet Take 1 tablet by mouth 2 (two) times daily as needed.  0   . dicyclomine (BENTYL) 10 MG capsule Take 1 tab by mouth 3 times daily as needed for cramping, diarrhea 90 capsule 1   . ferrous sulfate 325 (65 FE) MG tablet Take 325 mg by mouth daily with breakfast.     . ondansetron (ZOFRAN ODT) 4 MG disintegrating tablet Take 1 tablet (4 mg total) by mouth every 8 (eight) hours as needed for nausea or vomiting. 20 tablet 0   . PARoxetine (PAXIL) 20 MG tablet Take 40 mg by mouth daily.     Marland Kitchen  promethazine (PHENERGAN) 25 MG tablet Take 12.5 mg by mouth as needed for nausea or vomiting.     . rifaximin (XIFAXAN) 550 MG TABS tablet Take 1 tablet (550 mg total) by mouth 3 (three) times daily. 42 tablet 0   . traZODone (DESYREL) 150 MG tablet Take 1 tablet (150 mg total) by mouth at bedtime. 30 tablet 5   . vitamin B-12 (CYANOCOBALAMIN) 1000 MCG tablet Take 1,500 mcg by mouth 2 (two) times daily.       Allergies  Allergen Reactions  . Ace Inhibitors     REACTION: cough  . Penicillins Swelling  . Sulfa Antibiotics Nausea Only    Social History   Tobacco Use  . Smoking status: Former Smoker    Types: Cigarettes    Quit date: 03/11/1993    Years since quitting: 26.0   . Smokeless tobacco: Never Used  Substance Use Topics  . Alcohol use: Yes    Comment: rarely    Family History  Problem Relation Age of Onset  . Colon cancer Mother   . Heart disease Mother   . Hypertension Mother   . Stroke Mother   . Skin cancer Mother   . Colon cancer Paternal Aunt   . Breast cancer Paternal Aunt   . Lung cancer Paternal Aunt   . Colon cancer Other        mat. cousin  . Skin cancer Other        mother whole fam  . Esophageal cancer Father      Review of Systems  Constitutional: Negative for chills and fever.  Respiratory: Negative for cough and shortness of breath.   Cardiovascular: Negative for chest pain and palpitations.  Gastrointestinal: Negative for nausea and vomiting.  Musculoskeletal: Positive for joint pain.  Neurological: Negative for tingling and sensory change.   Objective:  Physical Exam Patient is a 63 year old female.  Well nourished and well developed. General: Alert and oriented x3, cooperative and pleasant, no acute distress. Head: normocephalic, atraumatic, neck supple. Eyes: EOMI. Respiratory: breath sounds clear in all fields, no wheezing, rales, or rhonchi. Cardiovascular: Regular rate and rhythm, no murmurs, gallops or rubs. Abdomen: non-tender to palpation and soft, normoactive bowel sounds.  Musculoskeletal: Right Knee Exam: No effusion present. Slight varus deformity. The range of motion is: 5 to 95 degrees. No crepitus on range of motion of the knee. Positive medial, greater than lateral, joint line tenderness. The knee is stable.  Calves soft and nontender. Motor function intact in LE. Strength 5/5 LE bilaterally. Neuro: Distal pulses 2+. Sensation to light touch intact in LE.  Vital signs in last 24 hours: Temp:  [98.8 F (37.1 C)] 98.8 F (37.1 C) (09/14 1244) Pulse Rate:  [86] 86 (09/14 1244) Resp:  [18] 18 (09/14 1244) BP: (157)/(94) 157/94 (09/14 1244) SpO2:  [97 %] 97 % (09/14 1244) Weight:  [89 kg]  89 kg (09/14 1244)  Labs:  Estimated body mass index is 32.65 kg/m as calculated from the following:   Height as of 03/31/19: 5\' 5"  (1.651 m).   Weight as of 03/31/19: 89 kg.  Imaging Review Plain radiographs demonstrate severe degenerative joint disease of the right knee(s). The overall alignment isneutral. The bone quality appears to be adequate for age and reported activity level.  Assessment/Plan:  End stage arthritis, right knee   The patient history, physical examination, clinical judgment of the provider and imaging studies are consistent with end stage degenerative joint disease of the right knee(s) and total  knee arthroplasty is deemed medically necessary. The treatment options including medical management, injection therapy arthroscopy and arthroplasty were discussed at length. The risks and benefits of total knee arthroplasty were presented and reviewed. The risks due to aseptic loosening, infection, stiffness, patella tracking problems, thromboembolic complications and other imponderables were discussed. The patient acknowledged the explanation, agreed to proceed with the plan and consent was signed. Patient is being admitted for inpatient treatment for surgery, pain control, PT, OT, prophylactic antibiotics, VTE prophylaxis, progressive ambulation and ADL's and discharge planning. The patient is planning to be discharged home.  Therapy Plans: Outpatient therapy at [facility] in [place] Disposition: Home with granddaughter Planned DVT Prophylaxis: Xarelto 10 mg daily (cannot tolerate ASA) DME needed: walker PCP: Fredda Hammed, PA-C, clearance received TXA: IV Allergies: PCN (swelling), NSAIDs (abdominal pain, nausea) Anesthesia Concerns: Nausea/vomiting, hypotension BMI: 30.8  Other: none  Patient's anticipated LOS is less than 2 midnights, meeting these requirements: - Younger than 68 - Lives within 1 hour of care - Has a competent adult at home to recover with  post-op recover - NO history of  - Chronic pain requiring opiods  - Diabetes  - Coronary Artery Disease  - Heart failure  - Heart attack  - Stroke  - DVT/VTE  - Cardiac arrhythmia  - Respiratory Failure/COPD  - Renal failure  - Anemia  - Advanced Liver disease  - Patient was instructed on what medications to stop prior to surgery. - Follow-up visit in 2 weeks with Dr. Wynelle Link - Begin physical therapy following surgery - Pre-operative lab work as pre-surgical testing - Prescriptions will be provided in hospital at time of discharge  Griffith Citron, PA-C Orthopedic Surgery [facility] [place]

## 2019-03-31 NOTE — Progress Notes (Signed)
PCP - Jani Gravel, MD Cardiologist -   Chest x-ray -  EKG -  Stress Test - 2017 epic  ECHO -  Cardiac Cath -   Sleep Study -  CPAP -   Fasting Blood Sugar -  Checks Blood Sugar _____ times a day  Blood Thinner Instructions: Aspirin Instructions: Last Dose:  Anesthesia review:  Reports after rotator cuff surgery and bariatric surgery , her bp bottomed out as low at 60/40. She was never told the cause but that it occurred once she was transferred from recovery to her room . reports that her bp normalized after they gave her " a saltwater mix of fluids to drink "   Patient denies shortness of breath, fever, cough and chest pain at PAT appointment   Patient verbalized understanding of instructions that were given to them at the PAT appointment. Patient was also instructed that they will need to review over the PAT instructions again at home before surgery.

## 2019-04-03 ENCOUNTER — Other Ambulatory Visit (HOSPITAL_COMMUNITY)
Admission: RE | Admit: 2019-04-03 | Discharge: 2019-04-03 | Disposition: A | Discharge status: Home / Self Care | Payer: BC Managed Care – PPO | Source: Ambulatory Visit | Attending: Orthopedic Surgery | Admitting: Orthopedic Surgery

## 2019-04-03 DIAGNOSIS — Z20828 Contact with and (suspected) exposure to other viral communicable diseases: Secondary | ICD-10-CM | POA: Insufficient documentation

## 2019-04-03 DIAGNOSIS — Z01812 Encounter for preprocedural laboratory examination: Secondary | ICD-10-CM | POA: Diagnosis present

## 2019-04-04 LAB — NOVEL CORONAVIRUS, NAA (HOSP ORDER, SEND-OUT TO REF LAB; TAT 18-24 HRS): SARS-CoV-2, NAA: NOT DETECTED

## 2019-04-06 MED ORDER — BUPIVACAINE LIPOSOME 1.3 % IJ SUSP
20.0000 mL | Freq: Once | INTRAMUSCULAR | Status: DC
  Filled 2019-04-06: qty 20

## 2019-04-07 ENCOUNTER — Inpatient Hospital Stay (HOSPITAL_COMMUNITY): Payer: BC Managed Care – PPO | Admitting: Physician Assistant

## 2019-04-07 ENCOUNTER — Other Ambulatory Visit: Payer: Self-pay

## 2019-04-07 ENCOUNTER — Inpatient Hospital Stay (HOSPITAL_COMMUNITY)
Admission: RE | Admit: 2019-04-07 | Discharge: 2019-04-08 | DRG: 470 | Disposition: A | Discharge status: Home / Self Care | Payer: BC Managed Care – PPO | Attending: Orthopedic Surgery | Admitting: Orthopedic Surgery

## 2019-04-07 ENCOUNTER — Encounter (HOSPITAL_COMMUNITY)
Admission: RE | Disposition: A | Discharge status: Home / Self Care | Payer: Self-pay | Source: Home / Self Care | Attending: Orthopedic Surgery

## 2019-04-07 ENCOUNTER — Inpatient Hospital Stay (HOSPITAL_COMMUNITY): Payer: BC Managed Care – PPO | Admitting: Certified Registered Nurse Anesthetist

## 2019-04-07 ENCOUNTER — Encounter (HOSPITAL_COMMUNITY): Payer: Self-pay | Admitting: Certified Registered Nurse Anesthetist

## 2019-04-07 DIAGNOSIS — I10 Essential (primary) hypertension: Secondary | ICD-10-CM | POA: Diagnosis present

## 2019-04-07 DIAGNOSIS — M1711 Unilateral primary osteoarthritis, right knee: Principal | ICD-10-CM | POA: Diagnosis present

## 2019-04-07 DIAGNOSIS — F411 Generalized anxiety disorder: Secondary | ICD-10-CM | POA: Diagnosis present

## 2019-04-07 DIAGNOSIS — K219 Gastro-esophageal reflux disease without esophagitis: Secondary | ICD-10-CM | POA: Diagnosis present

## 2019-04-07 DIAGNOSIS — Z90710 Acquired absence of both cervix and uterus: Secondary | ICD-10-CM | POA: Diagnosis not present

## 2019-04-07 DIAGNOSIS — Z87891 Personal history of nicotine dependence: Secondary | ICD-10-CM | POA: Diagnosis not present

## 2019-04-07 DIAGNOSIS — F322 Major depressive disorder, single episode, severe without psychotic features: Secondary | ICD-10-CM | POA: Diagnosis present

## 2019-04-07 DIAGNOSIS — Z96652 Presence of left artificial knee joint: Secondary | ICD-10-CM | POA: Diagnosis present

## 2019-04-07 DIAGNOSIS — M179 Osteoarthritis of knee, unspecified: Secondary | ICD-10-CM | POA: Diagnosis present

## 2019-04-07 DIAGNOSIS — M1710 Unilateral primary osteoarthritis, unspecified knee: Secondary | ICD-10-CM | POA: Diagnosis present

## 2019-04-07 DIAGNOSIS — M797 Fibromyalgia: Secondary | ICD-10-CM | POA: Diagnosis present

## 2019-04-07 DIAGNOSIS — Z79899 Other long term (current) drug therapy: Secondary | ICD-10-CM | POA: Diagnosis not present

## 2019-04-07 DIAGNOSIS — M25561 Pain in right knee: Secondary | ICD-10-CM | POA: Diagnosis present

## 2019-04-07 DIAGNOSIS — M170 Bilateral primary osteoarthritis of knee: Secondary | ICD-10-CM | POA: Diagnosis present

## 2019-04-07 HISTORY — PX: TOTAL KNEE ARTHROPLASTY: SHX125

## 2019-04-07 LAB — TYPE AND SCREEN
ABO/RH(D): O POS
Antibody Screen: NEGATIVE

## 2019-04-07 SURGERY — ARTHROPLASTY, KNEE, TOTAL
Anesthesia: Monitor Anesthesia Care | Site: Knee | Laterality: Right

## 2019-04-07 MED ORDER — DOCUSATE SODIUM 100 MG PO CAPS
100.0000 mg | ORAL_CAPSULE | Freq: Two times a day (BID) | ORAL | Status: DC
  Administered 2019-04-07 – 2019-04-08 (×2): 100 mg via ORAL
  Filled 2019-04-07 (×2): qty 1

## 2019-04-07 MED ORDER — PROPOFOL 500 MG/50ML IV EMUL
INTRAVENOUS | Status: DC | PRN
  Administered 2019-04-07: 75 ug/kg/min via INTRAVENOUS

## 2019-04-07 MED ORDER — METOCLOPRAMIDE HCL 5 MG/ML IJ SOLN: 5.0000 mg | Freq: Three times a day (TID) | INTRAMUSCULAR | Status: DC | PRN

## 2019-04-07 MED ORDER — FENTANYL CITRATE (PF) 100 MCG/2ML IJ SOLN
INTRAMUSCULAR | Status: AC
  Administered 2019-04-07: 50 ug via INTRAVENOUS
  Filled 2019-04-07: qty 2

## 2019-04-07 MED ORDER — DEXAMETHASONE SODIUM PHOSPHATE 10 MG/ML IJ SOLN
8.0000 mg | Freq: Once | INTRAMUSCULAR | Status: AC
  Administered 2019-04-07: 10 mg via INTRAVENOUS

## 2019-04-07 MED ORDER — FENTANYL CITRATE (PF) 100 MCG/2ML IJ SOLN
100.0000 ug | INTRAMUSCULAR | Status: DC
  Administered 2019-04-07 (×2): 50 ug via INTRAVENOUS

## 2019-04-07 MED ORDER — DEXAMETHASONE SODIUM PHOSPHATE 10 MG/ML IJ SOLN: 10.0000 mg | Freq: Once | INTRAMUSCULAR | Status: DC

## 2019-04-07 MED ORDER — SODIUM CHLORIDE 0.9 % IR SOLN
Status: DC | PRN
  Administered 2019-04-07: 1000 mL

## 2019-04-07 MED ORDER — ACETAMINOPHEN 500 MG PO TABS
1000.0000 mg | ORAL_TABLET | Freq: Four times a day (QID) | ORAL | Status: DC
  Administered 2019-04-07 – 2019-04-08 (×3): 1000 mg via ORAL
  Filled 2019-04-07 (×3): qty 2

## 2019-04-07 MED ORDER — SODIUM CHLORIDE (PF) 0.9 % IJ SOLN
INTRAMUSCULAR | Status: AC
  Filled 2019-04-07: qty 10

## 2019-04-07 MED ORDER — CHLORHEXIDINE GLUCONATE 4 % EX LIQD: 60.0000 mL | Freq: Once | CUTANEOUS | Status: DC

## 2019-04-07 MED ORDER — PROMETHAZINE HCL 25 MG PO TABS: 12.5000 mg | ORAL_TABLET | Freq: Every day | ORAL | Status: DC | PRN

## 2019-04-07 MED ORDER — TRAMADOL HCL 50 MG PO TABS
50.0000 mg | ORAL_TABLET | Freq: Four times a day (QID) | ORAL | Status: DC | PRN
  Administered 2019-04-07: 50 mg via ORAL
  Filled 2019-04-07: qty 1

## 2019-04-07 MED ORDER — ONDANSETRON HCL 4 MG/2ML IJ SOLN
4.0000 mg | Freq: Four times a day (QID) | INTRAMUSCULAR | Status: DC | PRN
  Administered 2019-04-07: 4 mg via INTRAVENOUS
  Filled 2019-04-07: qty 2

## 2019-04-07 MED ORDER — SODIUM CHLORIDE (PF) 0.9 % IJ SOLN
INTRAMUSCULAR | Status: AC
  Filled 2019-04-07: qty 50

## 2019-04-07 MED ORDER — DICYCLOMINE HCL 10 MG PO CAPS
10.0000 mg | ORAL_CAPSULE | Freq: Three times a day (TID) | ORAL | Status: DC | PRN
  Filled 2019-04-07: qty 1

## 2019-04-07 MED ORDER — MORPHINE SULFATE (PF) 2 MG/ML IV SOLN: 1.0000 mg | INTRAVENOUS | Status: DC | PRN

## 2019-04-07 MED ORDER — METHOCARBAMOL 500 MG IVPB - SIMPLE MED
500.0000 mg | Freq: Four times a day (QID) | INTRAVENOUS | Status: DC | PRN
  Administered 2019-04-07: 15:00:00 500 mg via INTRAVENOUS
  Filled 2019-04-07: qty 50

## 2019-04-07 MED ORDER — PHENOL 1.4 % MT LIQD: 1.0000 | OROMUCOSAL | Status: DC | PRN

## 2019-04-07 MED ORDER — CYCLOBENZAPRINE HCL 10 MG PO TABS: 10.0000 mg | ORAL_TABLET | Freq: Every evening | ORAL | Status: DC | PRN

## 2019-04-07 MED ORDER — DEXAMETHASONE SODIUM PHOSPHATE 10 MG/ML IJ SOLN
INTRAMUSCULAR | Status: AC
  Filled 2019-04-07: qty 1

## 2019-04-07 MED ORDER — ONDANSETRON HCL 4 MG PO TABS: 4.0000 mg | ORAL_TABLET | Freq: Four times a day (QID) | ORAL | Status: DC | PRN

## 2019-04-07 MED ORDER — MIDAZOLAM HCL 2 MG/2ML IJ SOLN
2.0000 mg | INTRAMUSCULAR | Status: DC
  Administered 2019-04-07 (×2): 1 mg via INTRAVENOUS

## 2019-04-07 MED ORDER — BUPIVACAINE IN DEXTROSE 0.75-8.25 % IT SOLN
INTRATHECAL | Status: DC | PRN
  Administered 2019-04-07: 1.6 mL via INTRATHECAL

## 2019-04-07 MED ORDER — PROPOFOL 10 MG/ML IV BOLUS
INTRAVENOUS | Status: DC | PRN
  Administered 2019-04-07: 10 mg via INTRAVENOUS
  Administered 2019-04-07: 20 mg via INTRAVENOUS

## 2019-04-07 MED ORDER — PROPOFOL 10 MG/ML IV BOLUS
INTRAVENOUS | Status: AC
  Filled 2019-04-07: qty 20

## 2019-04-07 MED ORDER — SODIUM CHLORIDE (PF) 0.9 % IJ SOLN
INTRAMUSCULAR | Status: DC | PRN
  Administered 2019-04-07: 60 mL

## 2019-04-07 MED ORDER — DIPHENHYDRAMINE HCL 12.5 MG/5ML PO ELIX: 12.5000 mg | ORAL_SOLUTION | ORAL | Status: DC | PRN

## 2019-04-07 MED ORDER — VANCOMYCIN HCL IN DEXTROSE 1-5 GM/200ML-% IV SOLN
1000.0000 mg | INTRAVENOUS | Status: AC
  Administered 2019-04-07: 1000 mg via INTRAVENOUS
  Filled 2019-04-07: qty 200

## 2019-04-07 MED ORDER — POLYETHYLENE GLYCOL 3350 17 G PO PACK: 17.0000 g | PACK | Freq: Every day | ORAL | Status: DC | PRN

## 2019-04-07 MED ORDER — RIVAROXABAN 10 MG PO TABS
10.0000 mg | ORAL_TABLET | Freq: Every day | ORAL | Status: DC
  Administered 2019-04-08: 10 mg via ORAL
  Filled 2019-04-07: qty 1

## 2019-04-07 MED ORDER — MIDAZOLAM HCL 2 MG/2ML IJ SOLN
INTRAMUSCULAR | Status: AC
  Administered 2019-04-07: 1 mg via INTRAVENOUS
  Filled 2019-04-07: qty 2

## 2019-04-07 MED ORDER — LACTATED RINGERS IV SOLN
INTRAVENOUS | Status: DC
  Administered 2019-04-07: 11:00:00 via INTRAVENOUS

## 2019-04-07 MED ORDER — FENTANYL CITRATE (PF) 100 MCG/2ML IJ SOLN
25.0000 ug | INTRAMUSCULAR | Status: DC | PRN
  Administered 2019-04-07: 15:00:00 50 ug via INTRAVENOUS

## 2019-04-07 MED ORDER — OXYCODONE HCL 5 MG PO TABS
5.0000 mg | ORAL_TABLET | ORAL | Status: DC | PRN
  Administered 2019-04-07 – 2019-04-08 (×3): 5 mg via ORAL
  Filled 2019-04-07 (×3): qty 1

## 2019-04-07 MED ORDER — ROPIVACAINE HCL 5 MG/ML IJ SOLN
INTRAMUSCULAR | Status: DC | PRN
  Administered 2019-04-07: 20 mL via PERINEURAL

## 2019-04-07 MED ORDER — METHOCARBAMOL 500 MG PO TABS
500.0000 mg | ORAL_TABLET | Freq: Four times a day (QID) | ORAL | Status: DC | PRN
  Administered 2019-04-07 – 2019-04-08 (×2): 500 mg via ORAL
  Filled 2019-04-07 (×2): qty 1

## 2019-04-07 MED ORDER — ONDANSETRON HCL 4 MG/2ML IJ SOLN
INTRAMUSCULAR | Status: DC | PRN
  Administered 2019-04-07: 4 mg via INTRAVENOUS

## 2019-04-07 MED ORDER — TRAZODONE HCL 50 MG PO TABS: 150.0000 mg | ORAL_TABLET | Freq: Every evening | ORAL | Status: DC | PRN

## 2019-04-07 MED ORDER — ACETAMINOPHEN 10 MG/ML IV SOLN
1000.0000 mg | Freq: Four times a day (QID) | INTRAVENOUS | Status: DC
  Administered 2019-04-07: 1000 mg via INTRAVENOUS
  Filled 2019-04-07: qty 100

## 2019-04-07 MED ORDER — ONDANSETRON HCL 4 MG/2ML IJ SOLN: 4.0000 mg | Freq: Four times a day (QID) | INTRAMUSCULAR | Status: DC | PRN

## 2019-04-07 MED ORDER — OXYCODONE HCL 5 MG/5ML PO SOLN: 5.0000 mg | Freq: Once | ORAL | Status: DC | PRN

## 2019-04-07 MED ORDER — GABAPENTIN 300 MG PO CAPS
300.0000 mg | ORAL_CAPSULE | Freq: Three times a day (TID) | ORAL | Status: DC
  Administered 2019-04-07 – 2019-04-08 (×3): 300 mg via ORAL
  Filled 2019-04-07 (×3): qty 1

## 2019-04-07 MED ORDER — VANCOMYCIN HCL IN DEXTROSE 1-5 GM/200ML-% IV SOLN
1000.0000 mg | Freq: Two times a day (BID) | INTRAVENOUS | Status: AC
  Administered 2019-04-07: 1000 mg via INTRAVENOUS
  Filled 2019-04-07: qty 200

## 2019-04-07 MED ORDER — MENTHOL 3 MG MT LOZG: 1.0000 | LOZENGE | OROMUCOSAL | Status: DC | PRN

## 2019-04-07 MED ORDER — METHOCARBAMOL 500 MG IVPB - SIMPLE MED
INTRAVENOUS | Status: AC
  Administered 2019-04-07: 500 mg via INTRAVENOUS
  Filled 2019-04-07: qty 50

## 2019-04-07 MED ORDER — ONDANSETRON HCL 4 MG/2ML IJ SOLN
INTRAMUSCULAR | Status: AC
  Filled 2019-04-07: qty 2

## 2019-04-07 MED ORDER — BISACODYL 10 MG RE SUPP: 10.0000 mg | Freq: Every day | RECTAL | Status: DC | PRN

## 2019-04-07 MED ORDER — FLEET ENEMA 7-19 GM/118ML RE ENEM: 1.0000 | ENEMA | Freq: Once | RECTAL | Status: DC | PRN

## 2019-04-07 MED ORDER — BUPIVACAINE LIPOSOME 1.3 % IJ SUSP
INTRAMUSCULAR | Status: DC | PRN
  Administered 2019-04-07: 20 mL

## 2019-04-07 MED ORDER — METOCLOPRAMIDE HCL 5 MG PO TABS: 5.0000 mg | ORAL_TABLET | Freq: Three times a day (TID) | ORAL | Status: DC | PRN

## 2019-04-07 MED ORDER — SODIUM CHLORIDE 0.9 % IV SOLN
INTRAVENOUS | Status: DC
  Administered 2019-04-07 – 2019-04-08 (×2): via INTRAVENOUS

## 2019-04-07 MED ORDER — TRANEXAMIC ACID-NACL 1000-0.7 MG/100ML-% IV SOLN
1000.0000 mg | INTRAVENOUS | Status: AC
  Administered 2019-04-07: 1000 mg via INTRAVENOUS
  Filled 2019-04-07: qty 100

## 2019-04-07 MED ORDER — POVIDONE-IODINE 10 % EX SWAB
2.0000 "application " | Freq: Once | CUTANEOUS | Status: AC
  Administered 2019-04-07: 2 via TOPICAL

## 2019-04-07 MED ORDER — OXYCODONE HCL 5 MG PO TABS: 5.0000 mg | ORAL_TABLET | Freq: Once | ORAL | Status: DC | PRN

## 2019-04-07 SURGICAL SUPPLY — 53 items
ATTUNE PS FEM RT SZ 6 CEM KNEE (Femur) ×1 IMPLANT
ATTUNE PSRP INSR SZ6 8 KNEE (Insert) ×1 IMPLANT
BAG ZIPLOCK 12X15 (MISCELLANEOUS) ×1 IMPLANT
BASE TIBIA ATTUNE KNEE SYS SZ6 (Knees) IMPLANT
BLADE SAG 18X100X1.27 (BLADE) ×2 IMPLANT
BLADE SAW SGTL 11.0X1.19X90.0M (BLADE) ×2 IMPLANT
BNDG ELASTIC 6X5.8 VLCR STR LF (GAUZE/BANDAGES/DRESSINGS) ×2 IMPLANT
BOWL SMART MIX CTS (DISPOSABLE) ×2 IMPLANT
CEMENT HV SMART SET (Cement) ×4 IMPLANT
COVER SURGICAL LIGHT HANDLE (MISCELLANEOUS) ×2 IMPLANT
COVER WAND RF STERILE (DRAPES) IMPLANT
CUFF TOURN SGL QUICK 34 (TOURNIQUET CUFF) ×1
CUFF TRNQT CYL 34X4.125X (TOURNIQUET CUFF) ×1 IMPLANT
DECANTER SPIKE VIAL GLASS SM (MISCELLANEOUS) ×3 IMPLANT
DRAPE U-SHAPE 47X51 STRL (DRAPES) ×2 IMPLANT
DRSG ADAPTIC 3X8 NADH LF (GAUZE/BANDAGES/DRESSINGS) ×2 IMPLANT
DRSG PAD ABDOMINAL 8X10 ST (GAUZE/BANDAGES/DRESSINGS) ×2 IMPLANT
DURAPREP 26ML APPLICATOR (WOUND CARE) ×2 IMPLANT
ELECT REM PT RETURN 15FT ADLT (MISCELLANEOUS) ×2 IMPLANT
EVACUATOR 1/8 PVC DRAIN (DRAIN) ×2 IMPLANT
GAUZE SPONGE 4X4 12PLY STRL (GAUZE/BANDAGES/DRESSINGS) ×2 IMPLANT
GLOVE BIO SURGEON STRL SZ8 (GLOVE) ×2 IMPLANT
GLOVE BIOGEL PI IND STRL 6.5 (GLOVE) ×1 IMPLANT
GLOVE BIOGEL PI IND STRL 8 (GLOVE) ×1 IMPLANT
GLOVE BIOGEL PI INDICATOR 6.5 (GLOVE) ×1
GLOVE BIOGEL PI INDICATOR 8 (GLOVE) ×1
GLOVE SURG SS PI 6.5 STRL IVOR (GLOVE) ×2 IMPLANT
GOWN STRL REUS W/TWL LRG LVL3 (GOWN DISPOSABLE) ×5 IMPLANT
HANDPIECE INTERPULSE COAX TIP (DISPOSABLE) ×1
HOLDER FOLEY CATH W/STRAP (MISCELLANEOUS) ×1 IMPLANT
IMMOBILIZER KNEE 20 (SOFTGOODS) ×2
IMMOBILIZER KNEE 20 THIGH 36 (SOFTGOODS) ×1 IMPLANT
KIT TURNOVER KIT A (KITS) IMPLANT
MANIFOLD NEPTUNE II (INSTRUMENTS) ×2 IMPLANT
NS IRRIG 1000ML POUR BTL (IV SOLUTION) ×2 IMPLANT
PACK TOTAL KNEE CUSTOM (KITS) ×2 IMPLANT
PADDING CAST COTTON 6X4 STRL (CAST SUPPLIES) ×6 IMPLANT
PATELLA MEDIAL ATTUN 35MM KNEE (Knees) ×1 IMPLANT
PIN DRILL FIX HALF THREAD (BIT) ×1 IMPLANT
PIN STEINMAN FIXATION KNEE (PIN) ×1 IMPLANT
PROTECTOR NERVE ULNAR (MISCELLANEOUS) ×2 IMPLANT
SET HNDPC FAN SPRY TIP SCT (DISPOSABLE) ×1 IMPLANT
STRIP CLOSURE SKIN 1/2X4 (GAUZE/BANDAGES/DRESSINGS) ×4 IMPLANT
SUT MNCRL AB 4-0 PS2 18 (SUTURE) ×2 IMPLANT
SUT STRATAFIX 0 PDS 27 VIOLET (SUTURE) ×2
SUT VIC AB 2-0 CT1 27 (SUTURE) ×3
SUT VIC AB 2-0 CT1 TAPERPNT 27 (SUTURE) ×3 IMPLANT
SUTURE STRATFX 0 PDS 27 VIOLET (SUTURE) ×1 IMPLANT
TIBIA ATTUNE KNEE SYS BASE SZ6 (Knees) ×2 IMPLANT
TRAY FOLEY MTR SLVR 14FR STAT (SET/KITS/TRAYS/PACK) ×1 IMPLANT
WATER STERILE IRR 1000ML POUR (IV SOLUTION) ×4 IMPLANT
WRAP KNEE MAXI GEL POST OP (GAUZE/BANDAGES/DRESSINGS) ×2 IMPLANT
YANKAUER SUCT BULB TIP 10FT TU (MISCELLANEOUS) ×2 IMPLANT

## 2019-04-07 NOTE — Anesthesia Procedure Notes (Signed)
Anesthesia Regional Block: Adductor canal block   Pre-Anesthetic Checklist: ,, timeout performed, Correct Patient, Correct Site, Correct Laterality, Correct Procedure, Correct Position, site marked, Risks and benefits discussed,  Surgical consent,  Pre-op evaluation,  At surgeon's request and post-op pain management  Laterality: Right  Prep: chloraprep       Needles:  Injection technique: Single-shot  Needle Type: Echogenic Needle     Needle Length: 9cm  Needle Gauge: 21     Additional Needles:   Narrative:  Start time: 04/07/2019 12:01 PM End time: 04/07/2019 12:06 PM Injection made incrementally with aspirations every 5 mL.  Performed by: Personally  Anesthesiologist: Albertha Ghee, MD  Additional Notes: Pt tolerated the procedure well.

## 2019-04-07 NOTE — Op Note (Signed)
OPERATIVE REPORT-TOTAL KNEE ARTHROPLASTY   Pre-operative diagnosis- Osteoarthritis  Right knee(s)  Post-operative diagnosis- Osteoarthritis Right knee(s)  Procedure-  Right  Total Knee Arthroplasty  Surgeon- Dione Plover. Leshonda Galambos, MD  Assistant- Ardeen Jourdain, PA-C   Anesthesia-  Adductor canal block and spinal  EBL-25 mL   Drains Hemovac  Tourniquet time-  Total Tourniquet Time Documented: Thigh (Right) - 33 minutes Total: Thigh (Right) - 33 minutes     Complications- None  Condition-PACU - hemodynamically stable.   Brief Clinical Note  Melanie [name] is a 63 y.o. year old female with end stage OA of her right knee with progressively worsening pain and dysfunction. She has constant pain, with activity and at rest and significant functional deficits with difficulties even with ADLs. She has had extensive non-op management including analgesics, injections of cortisone and viscosupplements, and home exercise program, but remains in significant pain with significant dysfunction.Radiographs show bone on bone arthritis medial and patellofemoral. She presents now for right Total Knee Arthroplasty.    Procedure in detail---   The patient is brought into the operating room and positioned supine on the operating table. After successful administration of  Adductor canal block and spinal,   a tourniquet is placed high on the  Right thigh(s) and the lower extremity is prepped and draped in the usual sterile fashion. Time out is performed by the operating team and then the  Right lower extremity is wrapped in Esmarch, knee flexed and the tourniquet inflated to 300 mmHg.       A midline incision is made with a ten blade through the subcutaneous tissue to the level of the extensor mechanism. A fresh blade is used to make a medial parapatellar arthrotomy. Soft tissue over the proximal medial tibia is subperiosteally elevated to the joint line with a knife and into the semimembranosus bursa with a  Cobb elevator. Soft tissue over the proximal lateral tibia is elevated with attention being paid to avoiding the patellar tendon on the tibial tubercle. The patella is everted, knee flexed 90 degrees and the ACL and PCL are removed. Findings are bone on bone medial and patellofemoral with large global osteophytes        The drill is used to create a starting hole in the distal femur and the canal is thoroughly irrigated with sterile saline to remove the fatty contents. The 5 degree Right  valgus alignment guide is placed into the femoral canal and the distal femoral cutting block is pinned to remove 9 mm off the distal femur. Resection is made with an oscillating saw.      The tibia is subluxed forward and the menisci are removed. The extramedullary alignment guide is placed referencing proximally at the medial aspect of the tibial tubercle and distally along the second metatarsal axis and tibial crest. The block is pinned to remove 32mm off the more deficient medial  side. Resection is made with an oscillating saw. Size 6is the most appropriate size for the tibia and the proximal tibia is prepared with the modular drill and keel punch for that size.      The femoral sizing guide is placed and size 6 is most appropriate. Rotation is marked off the epicondylar axis and confirmed by creating a rectangular flexion gap at 90 degrees. The size 6 cutting block is pinned in this rotation and the anterior, posterior and chamfer cuts are made with the oscillating saw. The intercondylar block is then placed and that cut is made.  Trial size 6 tibial component, trial size 6 posterior stabilized femur and a 8  mm posterior stabilized rotating platform insert trial is placed. Full extension is achieved with excellent varus/valgus and anterior/posterior balance throughout full range of motion. The patella is everted and thickness measured to be 22  mm. Free hand resection is taken to 12 mm, a 35 template is placed, lug  holes are drilled, trial patella is placed, and it tracks normally. Osteophytes are removed off the posterior femur with the trial in place. All trials are removed and the cut bone surfaces prepared with pulsatile lavage. Cement is mixed and once ready for implantation, the size 6 tibial implant, size  6 posterior stabilized femoral component, and the size 35 patella are cemented in place and the patella is held with the clamp. The trial insert is placed and the knee held in full extension. The Exparel (20 ml mixed with 60 ml saline) is injected into the extensor mechanism, posterior capsule, medial and lateral gutters and subcutaneous tissues.  All extruded cement is removed and once the cement is hard the permanent 8 mm posterior stabilized rotating platform insert is placed into the tibial tray.      The wound is copiously irrigated with saline solution and the extensor mechanism closed over a hemovac drain with #1 V-loc suture. The tourniquet is released for a total tourniquet time of 33  minutes. Flexion against gravity is 140 degrees and the patella tracks normally. Subcutaneous tissue is closed with 2.0 vicryl and subcuticular with running 4.0 Monocryl. The incision is cleaned and dried and steri-strips and a bulky sterile dressing are applied. The limb is placed into a knee immobilizer and the patient is awakened and transported to recovery in stable condition.      Please note that a surgical assistant was a medical necessity for this procedure in order to perform it in a safe and expeditious manner. Surgical assistant was necessary to retract the ligaments and vital neurovascular structures to prevent injury to them and also necessary for proper positioning of the limb to allow for anatomic placement of the prosthesis.   Dione Plover Elise Gladden, MD    04/07/2019, 1:27 PM

## 2019-04-07 NOTE — Anesthesia Preprocedure Evaluation (Signed)
Anesthesia Evaluation  Patient identified by MRN, date of birth, ID band Patient awake    Reviewed: Allergy & Precautions, H&P , NPO status , Patient's Chart, lab work & pertinent test results  History of Anesthesia Complications (+) PONV and history of anesthetic complications  Airway Mallampati: II   Neck ROM: full    Dental   Pulmonary former smoker,    breath sounds clear to auscultation       Cardiovascular hypertension,  Rhythm:regular Rate:Normal     Neuro/Psych PSYCHIATRIC DISORDERS Anxiety Depression  Neuromuscular disease    GI/Hepatic GERD  ,  Endo/Other    Renal/GU      Musculoskeletal  (+) Arthritis , Fibromyalgia -  Abdominal   Peds  Hematology   Anesthesia Other Findings   Reproductive/Obstetrics                             Anesthesia Physical Anesthesia Plan  ASA: II  Anesthesia Plan: Spinal and MAC   Post-op Pain Management:  Regional for Post-op pain   Induction: Intravenous  PONV Risk Score and Plan: 3 and Ondansetron, Dexamethasone, Propofol infusion, Midazolam and Treatment may vary due to age or medical condition  Airway Management Planned: Simple Face Mask  Additional Equipment:   Intra-op Plan:   Post-operative Plan:   Informed Consent: I have reviewed the patients History and Physical, chart, labs and discussed the procedure including the risks, benefits and alternatives for the proposed anesthesia with the patient or authorized representative who has indicated his/her understanding and acceptance.       Plan Discussed with: CRNA, Anesthesiologist and Surgeon  Anesthesia Plan Comments:         Anesthesia Quick Evaluation

## 2019-04-07 NOTE — Progress Notes (Signed)
Assisted Dr. Hodierne with right, ultrasound guided, adductor canal block. Side rails up, monitors on throughout procedure. See vital signs in flow sheet. Tolerated Procedure well.  

## 2019-04-07 NOTE — Discharge Instructions (Addendum)
° °Dr. Frank Aluisio °Total Joint Specialist °[facility] °[address] °[place], [place] [address] °[phone] ° °TOTAL KNEE REPLACEMENT POSTOPERATIVE DIRECTIONS ° °Knee Rehabilitation, Guidelines Following Surgery  °Results after knee surgery are often greatly improved when you follow the exercise, range of motion and muscle strengthening exercises prescribed by your doctor. Safety measures are also important to protect the knee from further injury. Any time any of these exercises cause you to have increased pain or swelling in your knee joint, decrease the amount until you are comfortable again and slowly increase them. If you have problems or questions, call your caregiver or physical therapist for advice.  ° °HOME CARE INSTRUCTIONS  °Remove items at home which could result in a fall. This includes throw rugs or furniture in walking pathways.  °· ICE to the affected knee every three hours for 30 minutes at a time and then as needed for pain and swelling.  Continue to use ice on the knee for pain and swelling from surgery. You may notice swelling that will progress down to the foot and ankle.  This is normal after surgery.  Elevate the leg when you are not up walking on it.   °· Continue to use the breathing machine which will help keep your temperature down.  It is common for your temperature to cycle up and down following surgery, especially at night when you are not up moving around and exerting yourself.  The breathing machine keeps your lungs expanded and your temperature down. °· Do not place pillow under knee, focus on keeping the knee straight while resting ° °DIET °You may resume your previous home diet once your are discharged from the hospital. ° °DRESSING / WOUND CARE / SHOWERING °You may shower 3 days after surgery, but keep the wounds dry during showering.  You may use an occlusive plastic wrap (Press'n Seal for example), NO SOAKING/SUBMERGING IN THE BATHTUB.  If the bandage gets  wet, change with a clean dry gauze.  If the incision gets wet, pat the wound dry with a clean towel. °You may start showering once you are discharged home but do not submerge the incision under water. Just pat the incision dry and apply a dry gauze dressing on daily. °Change the surgical dressing daily and reapply a dry dressing each time. ° °ACTIVITY °Walk with your walker as instructed. °Use walker as long as suggested by your caregivers. °Avoid periods of inactivity such as sitting longer than an hour when not asleep. This helps prevent blood clots.  °You may resume a sexual relationship in one month or when given the OK by your doctor.  °You may return to work once you are cleared by your doctor.  °Do not drive a car for 6 weeks or until released by you surgeon.  °Do not drive while taking narcotics. ° °WEIGHT BEARING °Weight bearing as tolerated with assist device (walker, cane, etc) as directed, use it as long as suggested by your surgeon or therapist, typically at least 4-6 weeks. ° °POSTOPERATIVE CONSTIPATION PROTOCOL °Constipation - defined medically as fewer than three stools per week and severe constipation as less than one stool per week. ° °One of the most common issues patients have following surgery is constipation.  Even if you have a regular bowel pattern at home, your normal regimen is likely to be disrupted due to multiple reasons following surgery.  Combination of anesthesia, postoperative narcotics, change in appetite and fluid intake all can affect your bowels.    In order to avoid complications following surgery, here are some recommendations in order to help you during your recovery period. ° °Colace (docusate) - Pick up an over-the-counter form of Colace or another stool softener and take twice a day as long as you are requiring postoperative pain medications.  Take with a full glass of water daily.  If you experience loose stools or diarrhea, hold the colace until you stool forms back up.  If  your symptoms do not get better within 1 week or if they get worse, check with your doctor. ° °Dulcolax (bisacodyl) - Pick up over-the-counter and take as directed by the product packaging as needed to assist with the movement of your bowels.  Take with a full glass of water.  Use this product as needed if not relieved by Colace only.  ° °MiraLax (polyethylene glycol) - Pick up over-the-counter to have on hand.  MiraLax is a solution that will increase the amount of water in your bowels to assist with bowel movements.  Take as directed and can mix with a glass of water, juice, soda, coffee, or tea.  Take if you go more than two days without a movement. °Do not use MiraLax more than once per day. Call your doctor if you are still constipated or irregular after using this medication for 7 days in a row. ° °If you continue to have problems with postoperative constipation, please contact the office for further assistance and recommendations.  If you experience "the worst abdominal pain ever" or develop nausea or vomiting, please contact the office immediatly for further recommendations for treatment. ° °ITCHING ° If you experience itching with your medications, try taking only a single pain pill, or even half a pain pill at a time.  You can also use Benadryl over the counter for itching or also to help with sleep.  ° °TED HOSE STOCKINGS °Wear the elastic stockings on both legs for three weeks following surgery during the day but you may remove then at night for sleeping. ° °MEDICATIONS °See your medication summary on the “After Visit Summary” that the nursing staff will review with you prior to discharge.  You may have some home medications which will be placed on hold until you complete the course of blood thinner medication.  It is important for you to complete the blood thinner medication as prescribed by your surgeon.  Continue your approved medications as instructed at time of discharge. ° °PRECAUTIONS °If you  experience chest pain or shortness of breath - call 911 immediately for transfer to the hospital emergency department.  °If you develop a fever greater that 101 F, purulent drainage from wound, increased redness or drainage from wound, foul odor from the wound/dressing, or calf pain - CONTACT YOUR SURGEON.   °                                                °FOLLOW-UP APPOINTMENTS °Make sure you keep all of your appointments after your operation with your surgeon and caregivers. You should call the office at the above phone number and make an appointment for approximately two weeks after the date of your surgery or on the date instructed by your surgeon outlined in the "After Visit Summary". ° ° °RANGE OF MOTION AND STRENGTHENING EXERCISES  °Rehabilitation of the knee is important following a knee injury or   an operation. After just a few days of immobilization, the muscles of the thigh which control the knee become weakened and shrink (atrophy). Knee exercises are designed to build up the tone and strength of the thigh muscles and to improve knee motion. Often times heat used for twenty to thirty minutes before working out will loosen up your tissues and help with improving the range of motion but do not use heat for the first two weeks following surgery. These exercises can be done on a training (exercise) mat, on the floor, on a table or on a bed. Use what ever works the best and is most comfortable for you Knee exercises include:  °Leg Lifts - While your knee is still immobilized in a splint or cast, you can do straight leg raises. Lift the leg to 60 degrees, hold for 3 sec, and slowly lower the leg. Repeat 10-20 times 2-3 times daily. Perform this exercise against resistance later as your knee gets better.  °Quad and Hamstring Sets - Tighten up the muscle on the front of the thigh (Quad) and hold for 5-10 sec. Repeat this 10-20 times hourly. Hamstring sets are done by pushing the foot backward against an object  and holding for 5-10 sec. Repeat as with quad sets.  °· Leg Slides: Lying on your back, slowly slide your foot toward your buttocks, bending your knee up off the floor (only go as far as is comfortable). Then slowly slide your foot back down until your leg is flat on the floor again. °· Angel Wings: Lying on your back spread your legs to the side as far apart as you can without causing discomfort.  °A rehabilitation program following serious knee injuries can speed recovery and prevent re-injury in the future due to weakened muscles. Contact your doctor or a physical therapist for more information on knee rehabilitation.  ° °IF YOU ARE TRANSFERRED TO A SKILLED REHAB FACILITY °If the patient is transferred to a skilled rehab facility following release from the hospital, a list of the current medications will be sent to the facility for the patient to continue.  When discharged from the skilled rehab facility, please have the facility set up the patient's Home Health Physical Therapy prior to being released. Also, the skilled facility will be responsible for providing the patient with their medications at time of release from the facility to include their pain medication, the muscle relaxants, and their blood thinner medication. If the patient is still at the rehab facility at time of the two week follow up appointment, the skilled rehab facility will also need to assist the patient in arranging follow up appointment in our office and any transportation needs. ° °MAKE SURE YOU:  °Understand these instructions.  °Get help right away if you are not doing well or get worse.  ° ° °Pick up stool softner and laxative for home use following surgery while on pain medications. °Do not submerge incision under water. °Please use good hand washing techniques while changing dressing each day. °May shower starting three days after surgery. °Please use a clean towel to pat the incision dry following showers. °Continue to use ice for  pain and swelling after surgery. °Do not use any lotions or creams on the incision until instructed by your surgeon. ° °Information on my medicine - XARELTO® (Rivaroxaban) ° ° ° °Why was Xarelto® prescribed for you? °Xarelto® was prescribed for you to reduce the risk of blood clots forming after orthopedic surgery. The medical term for   these abnormal blood clots is venous thromboembolism (VTE). ° °What do you need to know about xarelto® ? °Take your Xarelto® ONCE DAILY at the same time every day. °You may take it either with or without food. ° °If you have difficulty swallowing the tablet whole, you may crush it and mix in applesauce just prior to taking your dose. ° °Take Xarelto® exactly as prescribed by your doctor and DO NOT stop taking Xarelto® without talking to the doctor who prescribed the medication.  Stopping without other VTE prevention medication to take the place of Xarelto® may increase your risk of developing a clot. ° °After discharge, you should have regular check-up appointments with your healthcare provider that is prescribing your Xarelto®.   ° °What do you do if you miss a dose? °If you miss a dose, take it as soon as you remember on the same day then continue your regularly scheduled once daily regimen the next day. Do not take two doses of Xarelto® on the same day.  ° °Important Safety Information °A possible side effect of Xarelto® is bleeding. You should call your healthcare provider right away if you experience any of the following: °? Bleeding from an injury or your nose that does not stop. °? Unusual colored urine (red or dark brown) or unusual colored stools (red or black). °? Unusual bruising for unknown reasons. °? A serious fall or if you hit your head (even if there is no bleeding). ° °Some medicines may interact with Xarelto® and might increase your risk of bleeding while on Xarelto®. To help avoid this, consult your healthcare provider or pharmacist prior to using any new  prescription or non-prescription medications, including herbals, vitamins, non-steroidal anti-inflammatory drugs (NSAIDs) and supplements. ° °This website has more information on Xarelto®: www.xarelto.com. ° ° °

## 2019-04-07 NOTE — Anesthesia Procedure Notes (Signed)
Spinal  Patient location during procedure: OR Start time: 04/07/2019 12:28 PM End time: 04/07/2019 12:31 PM Staffing Anesthesiologist: Albertha Ghee, MD Performed: anesthesiologist  Preanesthetic Checklist Completed: patient identified, surgical consent, pre-op evaluation, timeout performed, IV checked, risks and benefits discussed and monitors and equipment checked Spinal Block Patient position: sitting Prep: DuraPrep Patient monitoring: cardiac monitor, continuous pulse ox and blood pressure Approach: midline Location: L3-4 Injection technique: single-shot Needle Needle type: Pencan  Needle gauge: 24 G Needle length: 9 cm Assessment Sensory level: T10 Additional Notes Functioning IV was confirmed and monitors were applied. Sterile prep and drape, including hand hygiene and sterile gloves were used. The patient was positioned and the spine was prepped. The skin was anesthetized with lidocaine.  Free flow of clear CSF was obtained prior to injecting local anesthetic into the CSF.  The spinal needle aspirated freely following injection.  The needle was carefully withdrawn.  The patient tolerated the procedure well.

## 2019-04-07 NOTE — Transfer of Care (Signed)
Immediate Anesthesia Transfer of Care Note  Patient: Melanie [name]  Procedure(s) Performed: TOTAL KNEE ARTHROPLASTY (Right Knee)  Patient Location: PACU  Anesthesia Type:Spinal  Level of Consciousness: awake, alert , oriented and patient cooperative  Airway & Oxygen Therapy: Patient Spontanous Breathing and Patient connected to face mask  Post-op Assessment: Report given to RN and Post -op Vital signs reviewed and stable  Post vital signs: Reviewed and stable  Last Vitals:  Vitals Value Taken Time  BP    Temp    Pulse 73 04/07/19 1347  Resp 16 04/07/19 1347  SpO2 100 % 04/07/19 1347  Vitals shown include unvalidated device data.  Last Pain:  Vitals:   04/07/19 1148  TempSrc:   PainSc: 2       Patients Stated Pain Goal: 3 ([id number])  Complications: No apparent anesthesia complications

## 2019-04-07 NOTE — Evaluation (Signed)
Physical Therapy Evaluation Patient Details Name: Melanie [name] MRN: [id number] DOB: 1956-01-25 Today's Date: 04/07/2019   History of Present Illness  Patient is 63 y.o. female s/p Lt TKA. PMH signficant for HTN, firoyalgia, depression, and arthritis.  Clinical Impression  DERICK AVITIA is a 63 y.o. female POD 0 s/p Rt TKA. Patient reports independence with mobility at baseline. Patient is now limited by functional impairments (see PT problem list below) and requires min assist for transfers and gait with RW. Patient was able to ambulate 40 feet with RW over 2 bouts and was limited by nausea. Patient instructed in exercise to facilitate ROM and circulation to manage edema. Patient will benefit from continued skilled PT interventions to address impairments and progress towards PLOF. Acute PT will follow to progress mobility and stair training in preparation for safe discharge home.     Follow Up Recommendations Follow surgeon's recommendation for DC plan and follow-up therapies    Equipment Recommendations  Rolling walker with 5" wheels(pt also encouraged to purchase shower seat)    Recommendations for Other Services       Precautions / Restrictions Precautions Precautions: Fall Restrictions Weight Bearing Restrictions: No      Mobility  Bed Mobility Overal bed mobility: Needs Assistance Bed Mobility: Supine to Sit     Supine to sit: Supervision;HOB elevated     General bed mobility comments: pt using bed rails, no cues needed to scoot and sit EOB  Transfers Overall transfer level: Needs assistance Equipment used: Rolling walker (2 wheeled) Transfers: Sit to/from Stand Sit to Stand: Min assist         General transfer comment: assist required for power up, cues for safe hand placement  Ambulation/Gait Ambulation/Gait assistance: Min assist Gait Distance (Feet): 40 Feet(2 bouts (~10'~30')) Assistive device: Rolling walker (2 wheeled) Gait Pattern/deviations:  Step-to pattern;Decreased step length - left;Decreased step length - right;Decreased stride length;Decreased stance time - right;Decreased weight shift to right Gait velocity: decreased   General Gait Details: pt limied in irst bout by sensation of nausea, stopped gait at ~ 10' for seated rest break, (SpO2 reained 96% or higher on RA, and HR remained at 103 or less) pt's symptoms resolved after seated break and she was then ale to ambulate 30 more feet with min assist and cues for safe management of RW. At end of second bout patient reported nasuea again and this resolved after seated break and cool compress to forehead. SpO2 was 97% or better and HR 104 or less.  Stairs            Wheelchair Mobility    Modified Rankin (Stroke Patients Only)       Balance Overall balance assessment: Needs assistance Sitting-balance support: Feet supported;No upper extremity supported Sitting balance-Leahy Scale: Good     Standing balance support: Bilateral upper extremity supported;During functional activity Standing balance-Leahy Scale: Fair Standing balance comment: pt requires support for dynamic standing and gait          Pertinent Vitals/Pain Pain Assessment: Faces Faces Pain Scale: Hurts little more Pain Location: Rt knee Pain Descriptors / Indicators: Aching;Sore Pain Intervention(s): Limited activity within patient's tolerance;Monitored during session;Ice applied    Home Living Family/patient expects to be discharged to:: Private residence Living Arrangements: Alone Available Help at Discharge: Family(pt's granddaughter) Type of Home: House Home Access: Stairs to enter Entrance Stairs-Rails: Psychiatric nurse of Steps: pt describes stairs: 1 curb step up, path leading to 3 steps with Rt hand rail, and hen  walk up to door where there is 1 step with Lt hand rail Home Layout: One level Home Equipment: Grab bars - tub/shower      Prior Function Level of  Independence: Independent         Comments: pt reports she was ambulating with no device prior to surgery, just limping     Hand Dominance        Extremity/Trunk Assessment   Upper Extremity Assessment Upper Extremity Assessment: Overall WFL for tasks assessed    Lower Extremity Assessment Lower Extremity Assessment: Generalized weakness;RLE deficits/detail RLE Deficits / Details: pt with adequate quad set in supine, no buckling noted in WB; sensation for light touch appears to be intact    Cervical / Trunk Assessment Cervical / Trunk Assessment: Normal  Communication   Communication: No difficulties  Cognition Arousal/Alertness: Awake/alert Behavior During Therapy: WFL for tasks assessed/performed Overall Cognitive Status: Within Functional Limits for tasks assessed           General Comments      Exercises Total Joint Exercises Ankle Circles/Pumps: AROM;Both;Seated;15 reps Quad Sets: AROM;5 reps;Seated;Supine(2 sets (1x5 supine, 1x5 seated))   Assessment/Plan    PT Assessment Patient needs continued PT services  PT Problem List Decreased strength;Decreased balance;Decreased range of motion;Decreased mobility;Decreased knowledge of use of DME;Decreased activity tolerance       PT Treatment Interventions DME instruction;Functional mobility training;Balance training;Patient/family education;Gait training;Therapeutic activities;Therapeutic exercise;Stair training;Manual techniques;Modalities    PT Goals (Current goals can be found in the Care Plan section)  Acute Rehab PT Goals Patient Stated Goal: none specified by patient, did discuss getting back to OPPT to improve knee mobility PT Goal Formulation: With patient Time For Goal Achievement: 04/14/19 Potential to Achieve Goals: Good    Frequency 7X/week    AM-PAC PT "6 Clicks" Mobility  Outcome Measure Help needed turning from your back to your side while in a flat bed without using bedrails?: A  Little Help needed moving from lying on your back to sitting on the side of a flat bed without using bedrails?: A Little Help needed moving to and from a bed to a chair (including a wheelchair)?: A Little Help needed standing up from a chair using your arms (e.g., wheelchair or bedside chair)?: A Little Help needed to walk in hospital room?: A Little Help needed climbing 3-5 steps with a railing? : A Little 6 Click Score: 18    End of Session Equipment Utilized During Treatment: Gait belt Activity Tolerance: Treatment limited secondary to medical complications (Comment)(nausea limitd gait) Patient left: in chair;with call bell/phone within reach;with chair alarm set Nurse Communication: Mobility status PT Visit Diagnosis: Unsteadiness on feet (R26.81);Other abnormalities of gait and mobility (R26.89);Muscle weakness (generalized) (M62.81)    Time: [id number] PT Time Calculation (min) (ACUTE ONLY): 30 min   Charges:   PT Evaluation $PT Eval Low Complexity: 1 Low PT Treatments $Gait Training: 8-22 mins        Kipp Brood, PT, DPT, [facility] Physical Therapist with [facility]  04/07/2019 7:06 PM

## 2019-04-07 NOTE — Interval H&P Note (Signed)
History and Physical Interval Note:  04/07/2019 11:24 AM  Melanie [name]  has presented today for surgery, with the diagnosis of right knee osteoarthritis.  The various methods of treatment have been discussed with the patient and family. After consideration of risks, benefits and other options for treatment, the patient has consented to  Procedure(s) with comments: TOTAL KNEE ARTHROPLASTY (Right) - 18min as a surgical intervention.  The patient's history has been reviewed, patient examined, no change in status, stable for surgery.  I have reviewed the patient's chart and labs.  Questions were answered to the patient's satisfaction.     Pilar Plate Atticus Wedin

## 2019-04-08 ENCOUNTER — Encounter (HOSPITAL_COMMUNITY): Payer: Self-pay | Admitting: Orthopedic Surgery

## 2019-04-08 LAB — BASIC METABOLIC PANEL
Anion gap: 6 (ref 5–15)
BUN: 17 mg/dL (ref 8–23)
CO2: 25 mmol/L (ref 22–32)
Calcium: 8.3 mg/dL — ABNORMAL LOW (ref 8.9–10.3)
Chloride: 107 mmol/L (ref 98–111)
Creatinine, Ser: 0.68 mg/dL (ref 0.44–1.00)
GFR calc Af Amer: >60 mL/min (ref >60)
GFR calc non Af Amer: >60 mL/min (ref >60)
Glucose, Bld: 125 mg/dL — ABNORMAL HIGH (ref 70–99)
Potassium: 4.4 mmol/L (ref 3.5–5.1)
Sodium: 138 mmol/L (ref 135–145)

## 2019-04-08 LAB — CBC
HCT: 35.4 % — ABNORMAL LOW (ref 36.0–46.0)
Hemoglobin: 11.3 g/dL — ABNORMAL LOW (ref 12.0–15.0)
MCH: 29.2 pg (ref 26.0–34.0)
MCHC: 31.9 g/dL (ref 30.0–36.0)
MCV: 91.5 fL (ref 80.0–100.0)
Platelets: 225 10*3/uL (ref 150–400)
RBC: 3.87 MIL/uL (ref 3.87–5.11)
RDW: 12.3 % (ref 11.5–15.5)
WBC: 10.6 10*3/uL — ABNORMAL HIGH (ref 4.0–10.5)
nRBC: 0 % (ref 0.0–0.2)

## 2019-04-08 MED ORDER — METHOCARBAMOL 500 MG PO TABS: 500.0000 mg | ORAL_TABLET | Freq: Four times a day (QID) | ORAL | 0 refills | Status: DC | PRN

## 2019-04-08 MED ORDER — GABAPENTIN 300 MG PO CAPS: 300.0000 mg | ORAL_CAPSULE | Freq: Three times a day (TID) | ORAL | 0 refills | Status: DC

## 2019-04-08 MED ORDER — RIVAROXABAN 10 MG PO TABS: 10.0000 mg | ORAL_TABLET | Freq: Every day | ORAL | 0 refills | Status: DC

## 2019-04-08 MED ORDER — OXYCODONE HCL 5 MG PO TABS: 5.0000 mg | ORAL_TABLET | Freq: Four times a day (QID) | ORAL | 0 refills | Status: DC | PRN

## 2019-04-08 MED ORDER — TRAMADOL HCL 50 MG PO TABS: 50.0000 mg | ORAL_TABLET | Freq: Four times a day (QID) | ORAL | 0 refills | Status: DC | PRN

## 2019-04-08 NOTE — TOC Transition Note (Signed)
Transition of Care [facility]) - CM/SW Discharge Note   Patient Details  Name: Melanie [name] MRN: [id number] Date of Birth: Mar 02, 1956  [facility]) CM/SW Contact:  Lia Hopping, [place] Phone Number: 04/08/2019, 10:58 AM   Clinical Narrative:    Therapy Plan: Outpatient PT [facility] RW ordered through [facility] delivered to pt. room   Final next level of care: OP Rehab(@ [facility]) Barriers to Discharge: No Barriers Identified   Patient Goals and CMS Choice Patient states their goals for this hospitalization and ongoing recovery are:: to get better   Choice offered to / list presented to : NA  Discharge Placement                       Discharge Plan and Services                DME Arranged: Walker rolling DME Agency: [employer] Date DME Agency Contacted: 04/08/19 Time DME Agency Contacted: [id number] Representative spoke with at DME Agency: [place] Interventions     Readmission Risk Interventions No flowsheet data found.

## 2019-04-08 NOTE — Progress Notes (Signed)
Physical Therapy Treatment Patient Details Name: Melanie [name] MRN: [id number] DOB: 02/02/1956 Today's Date: 04/08/2019    History of Present Illness Patient is 63 y.o. female s/p Lt TKA. PMH signficant for HTN, firoyalgia, depression, and arthritis.    PT Comments    Progressing well with mobility. Reviewed/practiced exercises, gait training, and stair training. Issued HEP for pt to perform 2-3x/day until she begins OP PT. All education completed. Okay to d/c from PT standpoint.    Follow Up Recommendations  Follow surgeon's recommendation for DC plan and follow-up therapies     Equipment Recommendations  Rolling walker with 5" wheels    Recommendations for Other Services       Precautions / Restrictions Precautions Precautions: Fall Restrictions Weight Bearing Restrictions: No    Mobility  Bed Mobility Overal bed mobility: Modified Independent                Transfers Overall transfer level: Needs assistance Equipment used: Rolling walker (2 wheeled) Transfers: Sit to/from Stand Sit to Stand: Supervision         General transfer comment: for safety  Ambulation/Gait Ambulation/Gait assistance: Supervision Gait Distance (Feet): 125 Feet Assistive device: Rolling walker (2 wheeled) Gait Pattern/deviations: Step-through pattern;Decreased stride length     General Gait Details: for safety.   Stairs Stairs: Yes Stairs assistance: Min guard Stair Management: Forwards;With walker;One rail Left   General stair comments: Practiced 3 steps with use of 1 handrail. Then practiced 1 step with use of RW. VCs safety, technique, sequence. Min guard for safety.   Wheelchair Mobility    Modified Rankin (Stroke Patients Only)       Balance Overall balance assessment: Mild deficits observed, not formally tested                                          Cognition Arousal/Alertness: Awake/alert Behavior During Therapy: WFL for tasks  assessed/performed Overall Cognitive Status: Within Functional Limits for tasks assessed                                        Exercises Total Joint Exercises Ankle Circles/Pumps: AROM;Both;10 reps;Supine Quad Sets: AROM;Both;10 reps;Supine Heel Slides: AAROM;Right;5 reps;Supine Hip ABduction/ADduction: AROM;Right;10 reps;Supine Straight Leg Raises: AROM;Right;10 reps;Supine Long Arc Quad: AROM;Right;10 reps;Seated Knee Flexion: AROM;Right;5 reps;Seated Goniometric ROM: ~5-70 degrees    General Comments        Pertinent Vitals/Pain Pain Assessment: 0-10 Pain Score: 4  Pain Location: Rt knee Pain Descriptors / Indicators: Sore;Discomfort Pain Intervention(s): Monitored during session    Home Living                      Prior Function            PT Goals (current goals can now be found in the care plan section) Progress towards PT goals: Progressing toward goals    Frequency    7X/week      PT Plan Current plan remains appropriate    Co-evaluation              AM-PAC PT "6 Clicks" Mobility   Outcome Measure  Help needed turning from your back to your side while in a flat bed without using bedrails?: A Little Help needed moving from lying on your back to  sitting on the side of a flat bed without using bedrails?: A Little Help needed moving to and from a bed to a chair (including a wheelchair)?: A Little Help needed standing up from a chair using your arms (e.g., wheelchair or bedside chair)?: A Little Help needed to walk in hospital room?: A Little Help needed climbing 3-5 steps with a railing? : A Little 6 Click Score: 18    End of Session Equipment Utilized During Treatment: Gait belt Activity Tolerance: Patient tolerated treatment well Patient left: in chair;with call bell/phone within reach   PT Visit Diagnosis: Other abnormalities of gait and mobility (R26.89)     Time: [id number] PT Time Calculation (min) (ACUTE  ONLY): 19 min  Charges:  $Gait Training: 8-22 mins                        Weston Anna, [place] Pager: [phone] Office: [phone]

## 2019-04-08 NOTE — Progress Notes (Signed)
Subjective: 1 Day Post-Op Procedure(s) (LRB): TOTAL KNEE ARTHROPLASTY (Right) Patient reports pain as mild.   Patient seen in rounds by Dr. Wynelle Link. Patient is well, and has had no acute complaints or problems other than discomfort in the right knee. No acute events overnight. Foley catheter removed, positive flatus. Patient states she is ready to go home today. Denies SHOB, CP.  We will continue therapy today.   Objective: Vital signs in last 24 hours: Temp:  [97.5 F (36.4 C)-98.2 F (36.8 C)] 98.1 F (36.7 C) (09/22 0543) Pulse Rate:  [60-90] 74 (09/22 0543) Resp:  [11-18] 16 (09/22 0543) BP: (124-154)/(67-96) 137/67 (09/22 0543) SpO2:  [95 %-100 %] 100 % (09/22 0543) Weight:  [89 kg] 89 kg (09/21 1104)  Intake/Output from previous day:  Intake/Output Summary (Last 24 hours) at 04/08/2019 0703 Last data filed at 04/08/2019 0600 Gross per 24 hour  Intake 3928.43 ml  Output 2525 ml  Net 1403.43 ml     Intake/Output this shift: No intake/output data recorded.  Labs: Recent Labs    04/08/19 0300  HGB 11.3*   Recent Labs    04/08/19 0300  WBC 10.6*  RBC 3.87  HCT 35.4*  PLT 225   Recent Labs    04/08/19 0300  NA 138  K 4.4  CL 107  CO2 25  BUN 17  CREATININE 0.68  GLUCOSE 125*  CALCIUM 8.3*   No results for input(s): LABPT, INR in the last 72 hours.  Exam: General - Patient is Alert and Oriented Extremity - Neurologically intact Sensation intact distally Intact pulses distally Dorsiflexion/Plantar flexion intact Dressing - dressing C/D/I Motor Function - intact, moving foot and toes well on exam.   Past Medical History:  Diagnosis Date  . Arthritis   . Barrett's esophagus    RESOLVED AS PER LAST ENDOSCOPY   . Depression   . Fibromyalgia   . Gallstones    RESOLVED WITH REMOVAL OF GALLBLADDER   . Hypertension    DX WHEN SHE WEIGHED OVER 300LB , LOST WEIGHT WITH AID OF BARIATRIC SURGERY , NOW  NO LONGER TAKES BP MEDICATION   . Obesity   .  Pernicious anemia    RESOLVED   . PONV (postoperative nausea and vomiting)     Assessment/Plan: 1 Day Post-Op Procedure(s) (LRB): TOTAL KNEE ARTHROPLASTY (Right) Principal Problem:   Osteoarthritis of both knees Active Problems:   OA (osteoarthritis) of knee  Estimated body mass index is 32.65 kg/m as calculated from the following:   Height as of this encounter: 5\' 5"  (1.651 m).   Weight as of this encounter: 89 kg. Advance diet Up with therapy D/C IV fluids   Patient's anticipated LOS is less than 2 midnights, meeting these requirements: - Younger than 67 - Lives within 1 hour of care - Has a competent adult at home to recover with post-op recover - NO history of  - Chronic pain requiring opiods  - Diabetes  - Coronary Artery Disease  - Heart failure  - Heart attack  - Stroke  - DVT/VTE  - Cardiac arrhythmia  - Respiratory Failure/COPD  - Renal failure  - Anemia  - Advanced Liver disease  DVT Prophylaxis - Xarelto Weight bearing as tolerated. D/C O2 and pulse ox and try on room air. Hemovac pulled without difficulty, will begin therapy today.  Plan is to go Home after hospital stay. She is scheduled for OPPT at [facility] in [place]. Plan for discharge today if she continues  to meet goals with therapy. She had some nausea with PT yesterday per chart review, so we will see how she does today. She will follow up in the office in 2 weeks.   Griffith Citron, PA-C Orthopedic Surgery 04/08/2019, 7:03 AM

## 2019-04-09 ENCOUNTER — Encounter (HOSPITAL_COMMUNITY): Payer: Self-pay | Admitting: Orthopedic Surgery

## 2019-04-09 NOTE — Anesthesia Postprocedure Evaluation (Signed)
Anesthesia Post Note  Patient: Melanie [name]  Procedure(s) Performed: TOTAL KNEE ARTHROPLASTY (Right Knee)     Patient location during evaluation: PACU Anesthesia Type: MAC and Spinal Level of consciousness: oriented and awake and alert Pain management: pain level controlled Vital Signs Assessment: post-procedure vital signs reviewed and stable Respiratory status: spontaneous breathing, respiratory function stable and patient connected to nasal cannula oxygen Cardiovascular status: blood pressure returned to baseline and stable Postop Assessment: no headache, no backache and no apparent nausea or vomiting Anesthetic complications: no    Last Vitals:  Vitals:   04/08/19 0543 04/08/19 0900  BP: 137/67 109/72  Pulse: 74 70  Resp: 16 16  Temp: 36.7 C 36.9 C  SpO2: 100% 96%    Last Pain:  Vitals:   04/08/19 0900  TempSrc: Oral  PainSc:                  Fannin S

## 2019-04-09 NOTE — Discharge Summary (Signed)
Physician Discharge Summary   Patient ID: Melanie [name] MRN: [id number] DOB/AGE: Apr 08, 1956 63 y.o.  Admit date: 04/07/2019 Discharge date: 04/08/2019  Primary Diagnosis: Osteoarthritis Right knee(s)  Admission Diagnoses:  Past Medical History:  Diagnosis Date  . Arthritis   . Barrett's esophagus    RESOLVED AS PER LAST ENDOSCOPY   . Depression   . Fibromyalgia   . Gallstones    RESOLVED WITH REMOVAL OF GALLBLADDER   . Hypertension    DX WHEN SHE WEIGHED OVER 300LB , LOST WEIGHT WITH AID OF BARIATRIC SURGERY , NOW  NO LONGER TAKES BP MEDICATION   . Obesity   . Pernicious anemia    RESOLVED   . PONV (postoperative nausea and vomiting)    Discharge Diagnoses:   Principal Problem:   Osteoarthritis of both knees Active Problems:   OA (osteoarthritis) of knee  Estimated body mass index is 32.65 kg/m as calculated from the following:   Height as of this encounter: 5\' 5"  (1.651 m).   Weight as of this encounter: 89 kg.  Procedure:  Procedure(s) (LRB): TOTAL KNEE ARTHROPLASTY (Right)   Consults: None  HPI: Melanie [name] is a 63 y.o. year old female with end stage OA of her right knee with progressively worsening pain and dysfunction. She has constant pain, with activity and at rest and significant functional deficits with difficulties even with ADLs. She has had extensive non-op management including analgesics, injections of cortisone and viscosupplements, and home exercise program, but remains in significant pain with significant dysfunction.Radiographs show bone on bone arthritis medial and patellofemoral. She presents now for right Total Knee Arthroplasty.    Laboratory Data: Admission on 04/07/2019, Discharged on 04/08/2019  Component Date Value Ref Range Status  . WBC 04/08/2019 10.6* 4.0 - 10.5 K/uL Final  . RBC 04/08/2019 3.87  3.87 - 5.11 MIL/uL Final  . Hemoglobin 04/08/2019 11.3* 12.0 - 15.0 g/dL Final  . HCT 04/08/2019 35.4* 36.0 - 46.0 % Final  . MCV 04/08/2019  91.5  80.0 - 100.0 fL Final  . MCH 04/08/2019 29.2  26.0 - 34.0 pg Final  . MCHC 04/08/2019 31.9  30.0 - 36.0 g/dL Final  . RDW 04/08/2019 12.3  11.5 - 15.5 % Final  . Platelets 04/08/2019 225  150 - 400 K/uL Final  . nRBC 04/08/2019 0.0  0.0 - 0.2 % Final   Performed at [facility], [place] [address], [place], [place] [address]  . Sodium 04/08/2019 138  135 - 145 mmol/L Final  . Potassium 04/08/2019 4.4  3.5 - 5.1 mmol/L Final  . Chloride 04/08/2019 107  98 - 111 mmol/L Final  . CO2 04/08/2019 25  22 - 32 mmol/L Final  . Glucose, Bld 04/08/2019 125* 70 - 99 mg/dL Final  . BUN 04/08/2019 17  8 - 23 mg/dL Final  . Creatinine, Ser 04/08/2019 0.68  0.44 - 1.00 mg/dL Final  . Calcium 04/08/2019 8.3* 8.9 - 10.3 mg/dL Final  . GFR calc non Af Amer 04/08/2019 >60  >60 mL/min Final  . GFR calc Af Amer 04/08/2019 >60  >60 mL/min Final  . Anion gap 04/08/2019 6  5 - 15 Final   Performed at [facility], [place] [address], [place], [place] [address]  Hospital Outpatient Visit on 04/03/2019  Component Date Value Ref Range Status  . SARS-CoV-2, NAA 04/03/2019 NOT DETECTED  NOT DETECTED Final   Comment: (NOTE) This nucleic acid amplification test was developed and its performance characteristics determined by  [employer]. Nucleic acid amplification tests include PCR and TMA. This test has not been FDA cleared or approved. This test has been authorized by FDA under an Emergency Use Authorization (EUA). This test is only authorized for the duration of time the declaration that circumstances exist justifying the authorization of the emergency use of in vitro diagnostic tests for detection of SARS-CoV-2 virus and/or diagnosis of COVID-19 infection under section 564(b)(1) of the Act, 21 U.S.C. [id number]) (1), unless the authorization is terminated or revoked sooner. When diagnostic testing is negative, the possibility of a false negative result should be  considered in the context of a patient's recent exposures and the presence of clinical signs and symptoms consistent with COVID-19. An individual without symptoms of COVID- 19 and who is not shedding SARS-CoV-2 vi                          rus would expect to have a negative (not detected) result in this assay. Performed At: [facility] [place], [place] [id number] Rush Farmer MD [id number]   . Coronavirus Source 04/03/2019 NASOPHARYNGEAL   Final   Performed at [facility], [place] [address], [place], [place] [address]  Hospital Outpatient Visit on 03/31/2019  Component Date Value Ref Range Status  . aPTT 03/31/2019 28  24 - 36 seconds Final   Performed at [facility], [place] [address] [place], [place] [address]  . WBC 03/31/2019 7.4  4.0 - 10.5 K/uL Final  . RBC 03/31/2019 4.91  3.87 - 5.11 MIL/uL Final  . Hemoglobin 03/31/2019 14.2  12.0 - 15.0 g/dL Final  . HCT 03/31/2019 45.9  36.0 - 46.0 % Final  . MCV 03/31/2019 93.5  80.0 - 100.0 fL Final  . MCH 03/31/2019 28.9  26.0 - 34.0 pg Final  . MCHC 03/31/2019 30.9  30.0 - 36.0 g/dL Final  . RDW 03/31/2019 12.7  11.5 - 15.5 % Final  . Platelets 03/31/2019 251  150 - 400 K/uL Final  . nRBC 03/31/2019 0.0  0.0 - 0.2 % Final   Performed at [facility], [place] [address] [place], [place] [address]  . Sodium 03/31/2019 142  135 - 145 mmol/L Final  . Potassium 03/31/2019 4.0  3.5 - 5.1 mmol/L Final  . Chloride 03/31/2019 110  98 - 111 mmol/L Final  . CO2 03/31/2019 26  22 - 32 mmol/L Final  . Glucose, Bld 03/31/2019 84  70 - 99 mg/dL Final  . BUN 03/31/2019 21  8 - 23 mg/dL Final  . Creatinine, Ser 03/31/2019 0.73  0.44 - 1.00 mg/dL Final  . Calcium 03/31/2019 8.7* 8.9 - 10.3 mg/dL Final  . Total Protein 03/31/2019 6.6  6.5 - 8.1 g/dL Final  . Albumin 03/31/2019 3.6  3.5 - 5.0 g/dL Final  . AST 03/31/2019 28  15 - 41 U/L Final  . ALT 03/31/2019 30  0 - 44 U/L Final  .  Alkaline Phosphatase 03/31/2019 115  38 - 126 U/L Final  . Total Bilirubin 03/31/2019 0.6  0.3 - 1.2 mg/dL Final  . GFR calc non Af Amer 03/31/2019 >60  >60 mL/min Final  . GFR calc Af Amer 03/31/2019 >60  >60 mL/min Final  . Anion gap 03/31/2019 6  5 - 15 Final   Performed at [facility], [place] [address] [place], [place] [address]  . Prothrombin Time 03/31/2019 13.1  11.4 - 15.2 seconds Final  .  INR 03/31/2019 1.0  0.8 - 1.2 Final   Comment: (NOTE) INR goal varies based on device and disease states. Performed at [facility], [place] [address], [place], [place] [address]   . ABO/RH(D) 03/31/2019 O POS   Final  . Antibody Screen 03/31/2019 NEG   Final  . Sample Expiration 03/31/2019 04/10/2019,2359   Final  . Extend sample reason 03/31/2019    Final                   Value:NO TRANSFUSIONS OR PREGNANCY IN THE PAST 3 MONTHS Performed at [place] [address], [place], [place] [address]   . MRSA, PCR 03/31/2019 NEGATIVE  NEGATIVE Final  . Staphylococcus aureus 03/31/2019 NEGATIVE  NEGATIVE Final   Comment: (NOTE) The Xpert SA Assay (FDA approved for NASAL specimens in patients 53 years of age and older), is one component of a comprehensive surveillance program. It is not intended to diagnose infection nor to guide or monitor treatment. Performed at [facility], [place] [address] [place], [place] [address]   . ABO/RH(D) 03/31/2019    Final                   Value:O POS Performed at [facility], [place] [address] [place], [place] [address]      X-Rays:No results found.  EKG: Orders placed or performed during the hospital encounter of 03/31/19  . EKG 12 lead  . EKG 12 lead     Hospital Course: TYRA RAISANEN is a 63 y.o. who was admitted to [facility]. They were brought to the operating room on 04/07/2019 and underwent Procedure(s): TOTAL KNEE ARTHROPLASTY.  Patient  tolerated the procedure well and was later transferred to the recovery room and then to the orthopaedic floor for postoperative care. They were given PO and IV analgesics for pain control following their surgery. They were given 24 hours of postoperative antibiotics of  Anti-infectives (From admission, onward)   Start     Dose/Rate Route Frequency Ordered Stop   04/07/19 2330  vancomycin (VANCOCIN) IVPB 1000 mg/200 mL premix     1,000 mg 200 mL/hr over 60 Minutes Intravenous Every 12 hours 04/07/19 1505 04/08/19 0012   04/07/19 1023  vancomycin (VANCOCIN) IVPB 1000 mg/200 mL premix     1,000 mg 200 mL/hr over 60 Minutes Intravenous 60 min pre-op 04/07/19 1023 04/07/19 1255     and started on DVT prophylaxis in the form of Xarelto.   PT and OT were ordered for total joint protocol. Discharge planning consulted to help with postop disposition and equipment needs.  Patient had a good night on the evening of surgery. They started to get up OOB with therapy on POD #0. Pt was seen during rounds and was ready to go home pending progress with therapy. Hemovac drain was pulled without difficulty. She worked with therapy on POD #1 and was meeting her goals. Pt was discharged to home later that day in stable condition.  Diet: Regular diet Activity: WBAT Follow-up: in 2 weeks Disposition: Home Discharged Condition: good   Discharge Instructions    Call MD / Call 911   Complete by: As directed    If you experience chest pain or shortness of breath, CALL 911 and be transported to the hospital emergency room.  If you develope a fever above 101 F, pus (white drainage) or increased drainage or redness at the wound, or calf pain, call your  surgeon's office.   Change dressing   Complete by: As directed    Change dressing on Wednesday, then change the dressing daily with sterile 4 x 4 inch gauze dressing and apply TED hose.   Constipation Prevention   Complete by: As directed    Drink plenty of fluids.   Prune juice may be helpful.  You may use a stool softener, such as Colace (over the counter) 100 mg twice a day.  Use MiraLax (over the counter) for constipation as needed.   Diet - low sodium heart healthy   Complete by: As directed    Discharge instructions   Complete by: As directed    Dr. Gaynelle Arabian Total Joint Specialist [facility] [address], [place], [place] [address] [phone]  TOTAL KNEE REPLACEMENT POSTOPERATIVE DIRECTIONS  Knee Rehabilitation, Guidelines Following Surgery  Results after knee surgery are often greatly improved when you follow the exercise, range of motion and muscle strengthening exercises prescribed by your doctor. Safety measures are also important to protect the knee from further injury. Any time any of these exercises cause you to have increased pain or swelling in your knee joint, decrease the amount until you are comfortable again and slowly increase them. If you have problems or questions, call your caregiver or physical therapist for advice.   HOME CARE INSTRUCTIONS  Remove items at home which could result in a fall. This includes throw rugs or furniture in walking pathways.  ICE to the affected knee every three hours for 30 minutes at a time and then as needed for pain and swelling.  Continue to use ice on the knee for pain and swelling from surgery. You may notice swelling that will progress down to the foot and ankle.  This is normal after surgery.  Elevate the leg when you are not up walking on it.   Continue to use the breathing machine which will help keep your temperature down.  It is common for your temperature to cycle up and down following surgery, especially at night when you are not up moving around and exerting yourself.  The breathing machine keeps your lungs expanded and your temperature down. Do not place pillow under knee, focus on keeping the knee straight while resting   DIET You may resume your previous home diet  once your are discharged from the hospital.  DRESSING / WOUND CARE / SHOWERING You may change your dressing 3-5 days after surgery.  Then change the dressing every day with sterile gauze.  Please use good hand washing techniques before changing the dressing.  Do not use any lotions or creams on the incision until instructed by your surgeon. You may start showering once you are discharged home but do not submerge the incision under water. Just pat the incision dry and apply a dry gauze dressing on daily. Change the surgical dressing daily and reapply a dry dressing each time.  ACTIVITY Walk with your walker as instructed. Use walker as long as suggested by your caregivers. Avoid periods of inactivity such as sitting longer than an hour when not asleep. This helps prevent blood clots.  You may resume a sexual relationship in one month or when given the OK by your doctor.  You may return to work once you are cleared by your doctor.  Do not drive a car for 6 weeks or until released by you surgeon.  Do not drive while taking narcotics.  WEIGHT BEARING Weight bearing as tolerated with assist device (  walker, cane, etc) as directed, use it as long as suggested by your surgeon or therapist, typically at least 4-6 weeks.  POSTOPERATIVE CONSTIPATION PROTOCOL Constipation - defined medically as fewer than three stools per week and severe constipation as less than one stool per week.  One of the most common issues patients have following surgery is constipation.  Even if you have a regular bowel pattern at home, your normal regimen is likely to be disrupted due to multiple reasons following surgery.  Combination of anesthesia, postoperative narcotics, change in appetite and fluid intake all can affect your bowels.  In order to avoid complications following surgery, here are some recommendations in order to help you during your recovery period.  Colace (docusate) - Pick up an over-the-counter form of  Colace or another stool softener and take twice a day as long as you are requiring postoperative pain medications.  Take with a full glass of water daily.  If you experience loose stools or diarrhea, hold the colace until you stool forms back up.  If your symptoms do not get better within 1 week or if they get worse, check with your doctor.  Dulcolax (bisacodyl) - Pick up over-the-counter and take as directed by the product packaging as needed to assist with the movement of your bowels.  Take with a full glass of water.  Use this product as needed if not relieved by Colace only.   MiraLax (polyethylene glycol) - Pick up over-the-counter to have on hand.  MiraLax is a solution that will increase the amount of water in your bowels to assist with bowel movements.  Take as directed and can mix with a glass of water, juice, soda, coffee, or tea.  Take if you go more than two days without a movement. Do not use MiraLax more than once per day. Call your doctor if you are still constipated or irregular after using this medication for 7 days in a row.  If you continue to have problems with postoperative constipation, please contact the office for further assistance and recommendations.  If you experience "the worst abdominal pain ever" or develop nausea or vomiting, please contact the office immediatly for further recommendations for treatment.  ITCHING  If you experience itching with your medications, try taking only a single pain pill, or even half a pain pill at a time.  You can also use Benadryl over the counter for itching or also to help with sleep.   TED HOSE STOCKINGS Wear the elastic stockings on both legs for three weeks following surgery during the day but you may remove then at night for sleeping.  MEDICATIONS See your medication summary on the "After Visit Summary" that the nursing staff will review with you prior to discharge.  You may have some home medications which will be placed on hold  until you complete the course of blood thinner medication.  It is important for you to complete the blood thinner medication as prescribed by your surgeon.  Continue your approved medications as instructed at time of discharge.  PRECAUTIONS If you experience chest pain or shortness of breath - call 911 immediately for transfer to the hospital emergency department.  If you develop a fever greater that 101 F, purulent drainage from wound, increased redness or drainage from wound, foul odor from the wound/dressing, or calf pain - CONTACT YOUR SURGEON.  FOLLOW-UP APPOINTMENTS Make sure you keep all of your appointments after your operation with your surgeon and caregivers. You should call the office at the above phone number and make an appointment for approximately two weeks after the date of your surgery or on the date instructed by your surgeon outlined in the "After Visit Summary".   RANGE OF MOTION AND STRENGTHENING EXERCISES  Rehabilitation of the knee is important following a knee injury or an operation. After just a few days of immobilization, the muscles of the thigh which control the knee become weakened and shrink (atrophy). Knee exercises are designed to build up the tone and strength of the thigh muscles and to improve knee motion. Often times heat used for twenty to thirty minutes before working out will loosen up your tissues and help with improving the range of motion but do not use heat for the first two weeks following surgery. These exercises can be done on a training (exercise) mat, on the floor, on a table or on a bed. Use what ever works the best and is most comfortable for you Knee exercises include:  Leg Lifts - While your knee is still immobilized in a splint or cast, you can do straight leg raises. Lift the leg to 60 degrees, hold for 3 sec, and slowly lower the leg. Repeat 10-20 times 2-3 times daily. Perform this exercise against  resistance later as your knee gets better.  Quad and Hamstring Sets - Tighten up the muscle on the front of the thigh (Quad) and hold for 5-10 sec. Repeat this 10-20 times hourly. Hamstring sets are done by pushing the foot backward against an object and holding for 5-10 sec. Repeat as with quad sets.  Leg Slides: Lying on your back, slowly slide your foot toward your buttocks, bending your knee up off the floor (only go as far as is comfortable). Then slowly slide your foot back down until your leg is flat on the floor again. Angel Wings: Lying on your back spread your legs to the side as far apart as you can without causing discomfort.  A rehabilitation program following serious knee injuries can speed recovery and prevent re-injury in the future due to weakened muscles. Contact your doctor or a physical therapist for more information on knee rehabilitation.   IF YOU ARE TRANSFERRED TO A SKILLED REHAB FACILITY If the patient is transferred to a skilled rehab facility following release from the hospital, a list of the current medications will be sent to the facility for the patient to continue.  When discharged from the skilled rehab facility, please have the facility set up the patient's [place] prior to being released. Also, the skilled facility will be responsible for providing the patient with their medications at time of release from the facility to include their pain medication, the muscle relaxants, and their blood thinner medication. If the patient is still at the rehab facility at time of the two week follow up appointment, the skilled rehab facility will also need to assist the patient in arranging follow up appointment in our office and any transportation needs.  MAKE SURE YOU:  Understand these instructions.  Get help right away if you are not doing well or get worse.    Pick up stool softner and laxative for home use following surgery while on pain medications. Do  not submerge incision under water. Please use good hand washing techniques while changing dressing each day. May shower starting three days after surgery.  Please use a clean towel to pat the incision dry following showers. Continue to use ice for pain and swelling after surgery. Do not use any lotions or creams on the incision until instructed by your surgeon.   Do not put a pillow under the knee. Place it under the heel.   Complete by: As directed    Driving restrictions   Complete by: As directed    No driving for two weeks   TED hose   Complete by: As directed    Use stockings (TED hose) for three weeks on both leg(s).  You may remove them at night for sleeping.   Weight bearing as tolerated   Complete by: As directed      Allergies as of 04/08/2019      Reactions   Ace Inhibitors    REACTION: cough   Nsaids Nausea And Vomiting   Stomach pain   Penicillins Swelling   As a child. Did it involve swelling of the face/tongue/throat, SOB, or low BP? Unknown Did it involve sudden or severe rash/hives, skin peeling, or any reaction on the inside of your mouth or nose? Unknown Did you need to seek medical attention at a hospital or doctor's office? Yes When did it last happen? Many years ago If all above answers are "NO", may proceed with cephalosporin use.   Sulfa Antibiotics Nausea Only      Medication List    STOP taking these medications   cyclobenzaprine 10 MG tablet Commonly known as: FLEXERIL     TAKE these medications   dicyclomine 10 MG capsule Commonly known as: BENTYL Take 1 tab by mouth 3 times daily as needed for cramping, diarrhea What changed:   how much to take  how to take this  when to take this  reasons to take this  additional instructions   gabapentin 300 MG capsule Commonly known as: NEURONTIN Take 1 capsule (300 mg total) by mouth 3 (three) times daily. Take a 300 mg capsule three times a day for two weeks following surgery.Then take a  300 mg capsule two times a day for two weeks. Then take a 300 mg capsule once a day for two weeks. Then discontinue the Gabapentin.   methocarbamol 500 MG tablet Commonly known as: ROBAXIN Take 1 tablet (500 mg total) by mouth every 6 (six) hours as needed for muscle spasms.   ondansetron 4 MG disintegrating tablet Commonly known as: Zofran ODT Take 1 tablet (4 mg total) by mouth every 8 (eight) hours as needed for nausea or vomiting.   oxyCODONE 5 MG immediate release tablet Commonly known as: Oxy IR/ROXICODONE Take 1-2 tablets (5-10 mg total) by mouth every 6 (six) hours as needed for moderate pain (pain score 4-6).   promethazine 25 MG tablet Commonly known as: PHENERGAN Take 12.5 mg by mouth daily as needed for nausea or vomiting.   rivaroxaban 10 MG Tabs tablet Commonly known as: XARELTO Take 1 tablet (10 mg total) by mouth daily with breakfast for 20 days. Take one tablet Xarelto once a day for three weeks following surgery. Then change to one baby Aspirin (81 mg) once a day for three weeks. Then discontinue Aspirin.   traMADol 50 MG tablet Commonly known as: ULTRAM Take 1-2 tablets (50-100 mg total) by mouth every 6 (six) hours as needed for moderate pain. What changed:   how much to take  when to take this  reasons to take this   traZODone 150 MG tablet Commonly known  as: DESYREL Take 1 tablet (150 mg total) by mouth at bedtime. What changed:   when to take this  reasons to take this            Discharge Care Instructions  (From admission, onward)         Start     Ordered   04/08/19 0000  Weight bearing as tolerated     04/08/19 0709   04/08/19 0000  Change dressing    Comments: Change dressing on Wednesday, then change the dressing daily with sterile 4 x 4 inch gauze dressing and apply TED hose.   04/08/19 0709         Follow-up Information    Gaynelle Arabian, MD. Schedule an appointment as soon as possible for a visit on 04/22/2019.    Specialty: Orthopedic Surgery Contact information: [address] [place] [address] [id number]           Signed: Griffith Citron, PA-C Orthopedic Surgery 04/09/2019, 7:22 AM

## 2019-07-09 NOTE — Telephone Encounter (Signed)
Made in error

## 2019-10-10 ENCOUNTER — Other Ambulatory Visit: Payer: Self-pay | Admitting: Internal Medicine

## 2019-10-10 ENCOUNTER — Other Ambulatory Visit: Payer: Self-pay

## 2019-10-10 ENCOUNTER — Ambulatory Visit
Admission: RE | Admit: 2019-10-10 | Discharge: 2019-10-10 | Disposition: A | Discharge status: Home / Self Care | Payer: BC Managed Care – PPO | Source: Ambulatory Visit | Attending: Internal Medicine | Admitting: Internal Medicine

## 2019-10-10 DIAGNOSIS — Z1231 Encounter for screening mammogram for malignant neoplasm of breast: Secondary | ICD-10-CM

## 2019-10-13 ENCOUNTER — Other Ambulatory Visit: Payer: Self-pay | Admitting: Internal Medicine

## 2019-10-13 DIAGNOSIS — R928 Other abnormal and inconclusive findings on diagnostic imaging of breast: Secondary | ICD-10-CM

## 2019-10-20 ENCOUNTER — Other Ambulatory Visit: Payer: Self-pay

## 2019-10-20 ENCOUNTER — Ambulatory Visit
Admission: RE | Admit: 2019-10-20 | Discharge: 2019-10-20 | Disposition: A | Discharge status: Home / Self Care | Payer: 59 | Source: Ambulatory Visit | Attending: Internal Medicine | Admitting: Internal Medicine

## 2019-10-20 DIAGNOSIS — R928 Other abnormal and inconclusive findings on diagnostic imaging of breast: Secondary | ICD-10-CM

## 2020-01-12 ENCOUNTER — Emergency Department (HOSPITAL_COMMUNITY)
Admission: EM | Admit: 2020-01-12 | Discharge: 2020-01-12 | Disposition: A | Discharge status: Home / Self Care | Payer: 59 | Attending: Emergency Medicine | Admitting: Emergency Medicine

## 2020-01-12 ENCOUNTER — Encounter (HOSPITAL_COMMUNITY): Payer: Self-pay | Admitting: *Deleted

## 2020-01-12 ENCOUNTER — Emergency Department (HOSPITAL_COMMUNITY): Payer: 59

## 2020-01-12 ENCOUNTER — Other Ambulatory Visit: Payer: Self-pay

## 2020-01-12 DIAGNOSIS — Z79899 Other long term (current) drug therapy: Secondary | ICD-10-CM | POA: Insufficient documentation

## 2020-01-12 DIAGNOSIS — Z87891 Personal history of nicotine dependence: Secondary | ICD-10-CM | POA: Diagnosis not present

## 2020-01-12 DIAGNOSIS — I10 Essential (primary) hypertension: Secondary | ICD-10-CM | POA: Insufficient documentation

## 2020-01-12 DIAGNOSIS — R079 Chest pain, unspecified: Secondary | ICD-10-CM | POA: Diagnosis present

## 2020-01-12 LAB — CBC
HCT: 44.2 % (ref 36.0–46.0)
Hemoglobin: 14.1 g/dL (ref 12.0–15.0)
MCH: 27.7 pg (ref 26.0–34.0)
MCHC: 31.9 g/dL (ref 30.0–36.0)
MCV: 86.8 fL (ref 80.0–100.0)
Platelets: 241 10*3/uL (ref 150–400)
RBC: 5.09 MIL/uL (ref 3.87–5.11)
RDW: 14.4 % (ref 11.5–15.5)
WBC: 8.5 10*3/uL (ref 4.0–10.5)
nRBC: 0 % (ref 0.0–0.2)

## 2020-01-12 LAB — HEPATIC FUNCTION PANEL
ALT: 14 U/L (ref 0–44)
AST: 16 U/L (ref 15–41)
Albumin: 2.9 g/dL — ABNORMAL LOW (ref 3.5–5.0)
Alkaline Phosphatase: 84 U/L (ref 38–126)
Bilirubin, Direct: 0.1 mg/dL (ref 0.0–0.2)
Indirect Bilirubin: 0.5 mg/dL (ref 0.3–0.9)
Total Bilirubin: 0.6 mg/dL (ref 0.3–1.2)
Total Protein: 5.1 g/dL — ABNORMAL LOW (ref 6.5–8.1)

## 2020-01-12 LAB — BASIC METABOLIC PANEL
Anion gap: 9 (ref 5–15)
BUN: 32 mg/dL — ABNORMAL HIGH (ref 8–23)
CO2: 22 mmol/L (ref 22–32)
Calcium: 8.1 mg/dL — ABNORMAL LOW (ref 8.9–10.3)
Chloride: 106 mmol/L (ref 98–111)
Creatinine, Ser: 0.79 mg/dL (ref 0.44–1.00)
GFR calc Af Amer: >60 mL/min (ref >60)
GFR calc non Af Amer: >60 mL/min (ref >60)
Glucose, Bld: 106 mg/dL — ABNORMAL HIGH (ref 70–99)
Potassium: 4 mmol/L (ref 3.5–5.1)
Sodium: 137 mmol/L (ref 135–145)

## 2020-01-12 LAB — TROPONIN I (HIGH SENSITIVITY)
Troponin I (High Sensitivity): 4 ng/L (ref <18)
Troponin I (High Sensitivity): 4 ng/L (ref <18)

## 2020-01-12 MED ORDER — ASPIRIN 325 MG PO TABS
325.0000 mg | ORAL_TABLET | Freq: Once | ORAL | Status: AC
  Administered 2020-01-12: 325 mg via ORAL
  Filled 2020-01-12: qty 1

## 2020-01-12 MED ORDER — NITROGLYCERIN 0.4 MG SL SUBL
0.4000 mg | SUBLINGUAL_TABLET | Freq: Once | SUBLINGUAL | Status: AC
  Administered 2020-01-12: 0.4 mg via SUBLINGUAL
  Filled 2020-01-12: qty 1

## 2020-01-12 MED ORDER — NITROGLYCERIN 0.4 MG SL SUBL
SUBLINGUAL_TABLET | SUBLINGUAL | Status: AC
  Filled 2020-01-12: qty 1

## 2020-01-12 MED ORDER — SODIUM CHLORIDE 0.9% FLUSH: 3.0000 mL | Freq: Once | INTRAVENOUS | Status: DC

## 2020-01-12 NOTE — ED Triage Notes (Signed)
Pt states she started having some upper abdominal pain that radiated to her back x 3 days ago and states today while in the store she started having chest pain that radiated to her left shoulder and pt c/o headache

## 2020-01-12 NOTE — ED Provider Notes (Signed)
[facility] EMERGENCY DEPARTMENT Provider Note   CSN: [id number] Arrival date & time: 01/12/20  1355     History Chief Complaint  Patient presents with  . Chest Pain    Melanie [name] is a 64 y.o. female.  HPI 64 year old female with a history of Barrett's esophagus, probably myalgia, gallstones, hypertension, obesity, GERD presents to the ER for several episodes of chest pain over the last 3 days.  Patient states that she was out at the mall initially, walking, when she started to feel burning in her chest, became diaphoretic, short of breath, with pain radiating to the back and left shoulder with associated headache.  She states that this episode lasted about an hour, she had to sit down and eventually this subsided.  She also states that that again this morning, when she was folding laundry and going up the stairs she started to feel a similar chest pain with radiation to the left shoulder as well.  She also endorsed diaphoresis during this episode as well.  She has no cardiac history, other than hypertension.  She did not take anything for her symptoms.  She states that she has a history of gastric reflux, but this feels different.  No fevers, chills, headache, weakness, syncope, nausea, vomiting, abdominal pain, back pain.    Past Medical History:  Diagnosis Date  . Arthritis   . Barrett's esophagus    RESOLVED AS PER LAST ENDOSCOPY   . Depression   . Fibromyalgia   . Gallstones    RESOLVED WITH REMOVAL OF GALLBLADDER   . Hypertension    DX WHEN SHE WEIGHED OVER 300LB , LOST WEIGHT WITH AID OF BARIATRIC SURGERY , NOW  NO LONGER TAKES BP MEDICATION   . Obesity   . Pernicious anemia    RESOLVED   . PONV (postoperative nausea and vomiting)     Patient Active Problem List   Diagnosis Date Noted  . OA (osteoarthritis) of knee 04/07/2019  . Severe major depression ([place]) 05/18/2015  . Memory impairment 05/18/2015  . Osteoarthritis of both knees 05/18/2015  . Healthcare  maintenance 05/18/2015  . Family history of colon cancer 03/11/2012  . Fibromyalgia 10/03/2007  . Vitamin D deficiency 02/04/2007  . Vitamin B 12 deficiency 02/03/2007  . ANXIETY STATE NEC 02/03/2007  . BARRETT'S ESOPHAGUS 02/03/2007  . Essential hypertension 12/13/2006  . GERD 12/13/2006  . HIATAL HERNIA 12/13/2006    Past Surgical History:  Procedure Laterality Date  . APPENDECTOMY    . BARIATRIC SURGERY    . BREAST CYST EXCISION Right    x 2  . CESAREAN SECTION     x 2  . CHOLECYSTECTOMY    . ovaries removed    . ROTATOR CUFF REPAIR Left   . TONSILLECTOMY    . TOTAL KNEE ARTHROPLASTY Left   . TOTAL KNEE ARTHROPLASTY Right 04/07/2019   Procedure: TOTAL KNEE ARTHROPLASTY;  Surgeon: Gaynelle Arabian, MD;  Location: [facility];  Service: Orthopedics;  Laterality: Right;  53min  . VAGINAL HYSTERECTOMY       OB History   No obstetric history on file.     Family History  Problem Relation Age of Onset  . Colon cancer Mother   . Heart disease Mother   . Hypertension Mother   . Stroke Mother   . Skin cancer Mother   . Colon cancer Paternal Aunt   . Breast cancer Paternal Aunt   . Lung cancer Paternal Aunt   . Colon cancer Other  mat. cousin  . Skin cancer Other        mother whole fam  . Esophageal cancer Father     Social History   Tobacco Use  . Smoking status: Former Smoker    Types: Cigarettes    Quit date: 03/11/1993    Years since quitting: 26.8  . Smokeless tobacco: Never Used  Substance Use Topics  . Alcohol use: Yes    Comment: rarely  . Drug use: No    Comment: "years ago"    Home Medications Prior to Admission medications   Medication Sig Start Date End Date Taking? Authorizing Provider  Cholecalciferol (VITAMIN D3) 125 MCG (5000 UT) CAPS Take 15,000 Units by mouth daily.   Yes [provider]  dicyclomine (BENTYL) 10 MG capsule Take 1 tab by mouth 3 times daily as needed for cramping, diarrhea Patient taking differently: Take 10 mg  by mouth 3 (three) times daily as needed (cramping or diarrhea).  02/04/18  Yes Esterwood, Amy S, PA-C  ferrous sulfate 325 (65 FE) MG EC tablet Take 325 mg by mouth daily.   Yes [provider]  hydrochlorothiazide (HYDRODIURIL) 25 MG tablet Take 25 mg by mouth daily as needed. FOR SWELLING-FLUID RETENTION 12/29/19  Yes [provider]  irbesartan (AVAPRO) 150 MG tablet Take 150 mg by mouth every morning. 12/16/19  Yes [provider]  PARoxetine (PAXIL) 20 MG tablet Take 20 mg by mouth every morning. 12/16/19  Yes [provider]  promethazine (PHENERGAN) 25 MG tablet Take 12.5 mg by mouth daily as needed for nausea or vomiting.    Yes [provider]  traZODone (DESYREL) 150 MG tablet Take 1 tablet (150 mg total) by mouth at bedtime. 08/04/15  Yes Burns, Arloa Koh, MD    Allergies    Ace inhibitors, Nsaids, Penicillins, and Sulfa antibiotics  Review of Systems   Review of Systems  Constitutional: Negative for chills and fever.  HENT: Negative for ear pain and sore throat.   Eyes: Negative for pain and visual disturbance.  Respiratory: Positive for shortness of breath. Negative for cough.   Cardiovascular: Positive for chest pain. Negative for palpitations.  Gastrointestinal: Negative for abdominal pain, nausea and vomiting.  Genitourinary: Negative for dysuria and hematuria.  Musculoskeletal: Negative for arthralgias and back pain.  Skin: Negative for color change and rash.  Neurological: Positive for weakness and headaches. Negative for dizziness, seizures, syncope and light-headedness.  Psychiatric/Behavioral: Negative for confusion.  All other systems reviewed and are negative.   Physical Exam Updated Vital Signs BP (!) 98/58 (BP Location: Right Arm)   Pulse 64   Temp 97.9 F (36.6 C) (Oral)   Resp 12   Ht 5\' 5"  (1.651 m)   Wt 94.3 kg   SpO2 97%   BMI 34.61 kg/m   Physical Exam Vitals and nursing note reviewed.  Constitutional:       General: She is not in acute distress.    Appearance: She is well-developed. She is not ill-appearing or diaphoretic.  HENT:     Head: Normocephalic and atraumatic.  Eyes:     Conjunctiva/sclera: Conjunctivae normal.  Cardiovascular:     Rate and Rhythm: Normal rate and regular rhythm.     Pulses:          Radial pulses are 2+ on the right side and 2+ on the left side.       Dorsalis pedis pulses are 2+ on the right side and 2+ on the left side.  Heart sounds: Normal heart sounds. No murmur heard.   Pulmonary:     Effort: Pulmonary effort is normal. No respiratory distress.     Breath sounds: Normal breath sounds. No decreased breath sounds.  Chest:     Chest wall: No tenderness.  Abdominal:     Palpations: Abdomen is soft.     Tenderness: There is no abdominal tenderness.  Musculoskeletal:     Cervical back: Neck supple.     Right lower leg: No edema.     Left lower leg: No edema.  Skin:    General: Skin is warm and dry.     Capillary Refill: Capillary refill takes less than 2 seconds.  Neurological:     General: No focal deficit present.     Mental Status: She is alert.  Psychiatric:        Mood and Affect: Mood normal.        Behavior: Behavior normal.     ED Results / Procedures / Treatments   Labs (all labs ordered are listed, but only abnormal results are displayed) Labs Reviewed  BASIC METABOLIC PANEL - Abnormal; Notable for the following components:      Result Value   Glucose, Bld 106 (*)    BUN 32 (*)    Calcium 8.1 (*)    All other components within normal limits  HEPATIC FUNCTION PANEL - Abnormal; Notable for the following components:   Total Protein 5.1 (*)    Albumin 2.9 (*)    All other components within normal limits  CBC  TROPONIN I (HIGH SENSITIVITY)  TROPONIN I (HIGH SENSITIVITY)    EKG EKG Interpretation  Date/Time:  Monday January 12 2020 14:07:27 EDT Ventricular Rate:  75 PR Interval:    QRS Duration: 80 QT Interval:  353 QTC  Calculation: 395 R Axis:   61 Text Interpretation: Sinus rhythm Low voltage, precordial leads No STEMI Confirmed by Octaviano Glow [phone]) on 01/12/2020 2:31:38 PM   Radiology DG Chest Portable 1 View  Result Date: 01/12/2020 CLINICAL DATA:  Chest pain over the last hour with nausea. EXAM: PORTABLE CHEST 1 VIEW COMPARISON:  04/17/2016 FINDINGS: Heart size is normal. Mediastinal shadows are normal. The lungs are clear. Mild chronic elevation of the right hemidiaphragm. No infiltrate, edema, effusion or collapse. No acute bone finding. Multiple surgical clips in the upper abdomen. IMPRESSION: No active disease. Electronically Signed   By: Nelson Chimes M.D.   On: 01/12/2020 14:23    Procedures Procedures (including critical care time)  Medications Ordered in ED Medications  sodium chloride flush (NS) 0.9 % injection 3 mL (3 mLs Intravenous Not Given 01/12/20 1625)  nitroGLYCERIN (NITROSTAT) 0.4 MG SL tablet (  Not Given 01/12/20 1634)  nitroGLYCERIN (NITROSTAT) SL tablet 0.4 mg (0.4 mg Sublingual Given 01/12/20 1624)  aspirin tablet 325 mg (325 mg Oral Given 01/12/20 1622)    ED Course  I have reviewed the triage vital signs and the nursing notes.  Pertinent labs & imaging results that were available during my care of the patient were reviewed by me and considered in my medical decision making (see chart for details).    MDM Rules/Calculators/A&P                         Patient with several episodes of chest pain, radiation to the left shoulder, diaphoresis, shortness of breath relieved with rest over the last few days On presentation, she is alert and oriented, nontoxic-appearing, in  no acute distress.  Vitals are overall reassuring. Physical exam without any lower extremity edema, soft and nontender abdomen, normal heart sounds.  BMP without significant electrode abnormalities, CBC without leukocytosis.  AST/ALT normal.  Delta troponin negative.  EKG unchanged from previous.  Chest x-ray  without acute abnormalities.  Overall work-up reassuring, consulted cardiology Dr.Hilty and discussed the case, they feel that the patient is stable for outpatient follow-up with cardiology.  I discussed these findings with the patient, and she is overall reassured and is agreeable.  Symptoms could be cardiogenic, however given her history of GERD this also might be a contributing factor.  Patient is to be discharged with recommendation to follow up with cardiology in regards to today's hospital visit.  No evidence of ACS, PERC negative, VSS, no tracheal deviation, no JVD or new murmur, RRR, breath sounds equal bilaterally, EKG without acute abnormalities, negative troponin, and negative CXR.  Strict return precautions given.  Pt appears reliable for follow up and is agreeable to discharge.   Case has been discussed with Dr. Sabra Heck who agrees with the above plan to discharge.    Final Clinical Impression(s) / ED Diagnoses Final diagnoses:  Chest pain, unspecified type    Rx / DC Orders ED Discharge Orders    None       Lyndel Safe 01/12/20 1841    Noemi Chapel, MD 01/13/20 1733

## 2020-01-12 NOTE — Discharge Instructions (Signed)
Your work-up today was overall reassuring. Please follow-up with the cardiology group provided in the discharge paperwork. Return to the ER if your symptoms worsen.

## 2020-11-23 ENCOUNTER — Other Ambulatory Visit: Payer: Self-pay

## 2020-11-23 ENCOUNTER — Ambulatory Visit (INDEPENDENT_AMBULATORY_CARE_PROVIDER_SITE_OTHER): Payer: Medicare Other | Admitting: Physician Assistant

## 2020-11-23 ENCOUNTER — Encounter: Payer: Self-pay | Admitting: Physician Assistant

## 2020-11-23 VITALS — BP 130/90 | HR 72 | Ht 63.0 in | Wt 210.0 lb

## 2020-11-23 DIAGNOSIS — Z800 Family history of malignant neoplasm of digestive organs: Secondary | ICD-10-CM

## 2020-11-23 DIAGNOSIS — R110 Nausea: Secondary | ICD-10-CM | POA: Diagnosis not present

## 2020-11-23 DIAGNOSIS — R194 Change in bowel habit: Secondary | ICD-10-CM | POA: Diagnosis not present

## 2020-11-23 DIAGNOSIS — R109 Unspecified abdominal pain: Secondary | ICD-10-CM | POA: Diagnosis not present

## 2020-11-23 MED ORDER — DICYCLOMINE HCL 10 MG PO CAPS: 20.0000 mg | ORAL_CAPSULE | Freq: Four times a day (QID) | ORAL | 2 refills | Status: DC

## 2020-11-23 MED ORDER — DICYCLOMINE HCL 20 MG PO TABS: 20.0000 mg | ORAL_TABLET | Freq: Four times a day (QID) | ORAL | 2 refills | Status: DC

## 2020-11-23 NOTE — Addendum Note (Signed)
Addended by: Darrall Dears on: 11/23/2020 10:31 AM   Modules accepted: Orders

## 2020-11-23 NOTE — Patient Instructions (Addendum)
If you are age 66 or older, your body mass index should be between 23-30. Your Body mass index is 37.2 kg/m. If this is out of the aforementioned range listed, please consider follow up with your Primary Care Provider.  If you are age 29 or younger, your body mass index should be between 19-25. Your Body mass index is 37.2 kg/m. If this is out of the aformentioned range listed, please consider follow up with your Primary Care Provider.   You have been scheduled for an endoscopy and colonoscopy. Please follow the written instructions given to you at your visit today. Please pick up your prep supplies at the pharmacy within the next 1-3 days. If you use inhalers (even only as needed), please bring them with you on the day of your procedure.  We have sent the following medications to your pharmacy for you to pick up at your convenience: Dicyclomine 20 mg   Thank you for choosing me and [facility].  Ellouise Newer, PA-C

## 2020-11-23 NOTE — Progress Notes (Signed)
Agree with the assessment and plan as outlined by Jennifer Lemmon, PA-C. ? ?Macklyn Glandon, DO, FACG ? ?

## 2020-11-23 NOTE — Progress Notes (Signed)
Noted  

## 2020-11-23 NOTE — Progress Notes (Signed)
Chief Complaint: Abdominal pain, change in bowel habits  HPI:    Melanie [name] is a 65 year old female with a past medical history as listed below including status post Roux-en-Y gastric bypass and status postcholecystectomy as well as fibromyalgia and a family history of colon cancer in her mom, known to Dr. Henrene Pastor, who was referred to me by Jani Gravel, MD for a complaint of abdominal pain and change in bowel habits.     02/2012 colonoscopy normal.  EGD at the same time which showed a normal-appearing esophagus and GE junction and normal stomach status post gastric bypass.    02/04/2018 patient seen in clinic by Nicoletta Ba for diarrhea.  At that time recheck sed rate, CRP, GI path panel and C. difficile and given a trial of Bentyl 10 mg p.o. 3 times daily and scheduled for colonoscopy.    03/04/2018 colonoscopy normal.  Biopsies were normal.  She was given Xifaxan 550 3 times daily x2 weeks for possible SIBO.  Repeat recommended in 5 years given family history of colon cancer.    Today, the patient presents to clinic and tells me that years ago she was told she had irritable bowel syndrome and would occasionally have some diarrhea but over the past few months she has had a constant alternation from constipation to diarrhea with the pain up on the left side of her abdomen which seems to sometimes wrap around to her back.  This is definitely worse with constipation but is pretty constant at this point rated as a 5-12/2008.  Along with this has had some irritation of her hemorrhoids recently which are bleeding and wakes up with a nauseous feeling in the mornings for which she uses Phenergan occasionally.  Tells me the diarrhea can hit any time and due to this she has not really been leaving her house much.  Also describes some bloating.  Has been using Dicyclomine 10 mg 3 times daily which sometimes helps with pain.    Patient is very nervous given her family history of colon cancer and tells me that one of her  bosses was also just recently diagnosed with terminal colon cancer and his only symptom was bloating.  Also had a husband that had colon cancer and his only symptom was left sided abdominal pain.    Denies fever, chills, weight loss, heartburn, reflux or symptoms that awaken her from sleep.     Past Medical History:  Diagnosis Date  . Arthritis   . Barrett's esophagus    RESOLVED AS PER LAST ENDOSCOPY   . Depression   . Fibromyalgia   . Gallstones    RESOLVED WITH REMOVAL OF GALLBLADDER   . Hypertension    DX WHEN SHE WEIGHED OVER 300LB , LOST WEIGHT WITH AID OF BARIATRIC SURGERY , NOW  NO LONGER TAKES BP MEDICATION   . Obesity   . Pernicious anemia    RESOLVED   . PONV (postoperative nausea and vomiting)     Past Surgical History:  Procedure Laterality Date  . APPENDECTOMY    . BARIATRIC SURGERY    . BREAST CYST EXCISION Right    x 2  . CESAREAN SECTION     x 2  . CHOLECYSTECTOMY    . ovaries removed    . ROTATOR CUFF REPAIR Left   . TONSILLECTOMY    . TOTAL KNEE ARTHROPLASTY Left   . TOTAL KNEE ARTHROPLASTY Right 04/07/2019   Procedure: TOTAL KNEE ARTHROPLASTY;  Surgeon: Gaynelle Arabian, MD;  Location:  [facility];  Service: Orthopedics;  Laterality: Right;  43min  . VAGINAL HYSTERECTOMY      Current Outpatient Medications  Medication Sig Dispense Refill  . Cholecalciferol (VITAMIN D3) 125 MCG (5000 UT) CAPS Take 15,000 Units by mouth daily.    Marland Kitchen dicyclomine (BENTYL) 10 MG capsule Take 1 tab by mouth 3 times daily as needed for cramping, diarrhea (Patient taking differently: Take 10 mg by mouth 3 (three) times daily as needed (cramping or diarrhea). ) 90 capsule 1  . ferrous sulfate 325 (65 FE) MG EC tablet Take 325 mg by mouth daily.    . hydrochlorothiazide (HYDRODIURIL) 25 MG tablet Take 25 mg by mouth daily as needed. FOR SWELLING-FLUID RETENTION    . irbesartan (AVAPRO) 150 MG tablet Take 150 mg by mouth every morning.    Marland Kitchen PARoxetine (PAXIL) 20 MG tablet Take 20 mg by  mouth every morning.    . promethazine (PHENERGAN) 25 MG tablet Take 12.5 mg by mouth daily as needed for nausea or vomiting.     . traZODone (DESYREL) 150 MG tablet Take 1 tablet (150 mg total) by mouth at bedtime. 30 tablet 5   No current facility-administered medications for this visit.    Allergies as of 11/23/2020 - Review Complete 01/12/2020  Allergen Reaction Noted  . Ace inhibitors Cough 10/21/2007  . Nsaids Nausea And Vomiting 04/07/2019  . Penicillins Swelling 02/04/2007  . Sulfa antibiotics Nausea Only 03/11/2012    Family History  Problem Relation Age of Onset  . Colon cancer Mother   . Heart disease Mother   . Hypertension Mother   . Stroke Mother   . Skin cancer Mother   . Colon cancer Paternal Aunt   . Breast cancer Paternal Aunt   . Lung cancer Paternal Aunt   . Colon cancer Other        mat. cousin  . Skin cancer Other        mother whole fam  . Esophageal cancer Father     Social History   Socioeconomic History  . Marital status: Divorced    Spouse name: Not on file  . Number of children: 2  . Years of education: college  . Highest education level: Not on file  Occupational History  . Occupation: cma  Tobacco Use  . Smoking status: Former Smoker    Types: Cigarettes    Quit date: 03/11/1993    Years since quitting: 27.7  . Smokeless tobacco: Never Used  Substance and Sexual Activity  . Alcohol use: Yes    Comment: rarely  . Drug use: No    Comment: "years ago"  . Sexual activity: Not on file  Other Topics Concern  . Not on file  Social History Narrative   Drinks 2-4 caffeine drinks a day    Social Determinants of Health   Financial Resource Strain: Not on file  Food Insecurity: Not on file  Transportation Needs: Not on file  Physical Activity: Not on file  Stress: Not on file  Social Connections: Not on file  Intimate Partner Violence: Not on file    Review of Systems:    Constitutional: No weight loss, fever or  chills Cardiovascular: No chest pain Respiratory: No SOB  Gastrointestinal: See HPI and otherwise negative   Physical Exam:  Vital signs: BP 130/90   Pulse 72   Ht 5\' 3"  (1.6 m)   Wt 210 lb (95.3 kg)   BMI 37.20 kg/m   Constitutional:   Pleasant overweight  Caucasian female appears to be in NAD, Well developed, Well nourished, alert and cooperative Head:  Normocephalic and atraumatic. Eyes:   PEERL, EOMI. No icterus. Conjunctiva pink. Ears:  Normal auditory acuity. Neck:  Supple Throat: Oral cavity and pharynx without inflammation, swelling or lesion.  Respiratory: Respirations even and unlabored. Lungs clear to auscultation bilaterally.   No wheezes, crackles, or rhonchi.  Cardiovascular: Normal S1, S2. No MRG. Regular rate and rhythm. No peripheral edema, cyanosis or pallor.  Gastrointestinal:  Soft, nondistended, mild left sided abdominal TTP. No rebound or guarding.  Decreased bowel sounds all 4 quadrants. No appreciable masses or hepatomegaly. Rectal:  Not performed.  Msk:  Symmetrical without gross deformities. Without edema, no deformity or joint abnormality.  Neurologic:  Alert and  oriented x4;  grossly normal neurologically.  Skin:   Dry and intact without significant lesions or rashes. Psychiatric:  Demonstrates good judgement and reason without abnormal affect or behaviors.  No recent labs or imaging.  Assessment: 1.  Change in bowel habits: Alternating between diarrhea and constipation with increased frequency; likely IBS 2.  Left-sided abdominal pain: Increasing over the past few months with more variance in her bowel habits; most likely IBS 3.  Nausea: For the past few months, wakes up with nausea sometimes relieved with Phenergan, no heartburn or reflux; consider constipation versus gastritis versus other 4.  Family history of colon cancer: In her mother  Plan: 1.  Patient would like to go ahead and have her colonoscopy now as well as an EGD given her recent  nausea.  She is very nervous given her family history of colon cancer and being surrounded by other friends who have been diagnosed lately.  Likely her symptoms are all related to irritable bowel. 2.  Martin Majestic ahead and scheduled the patient for diagnostic EGD and colonoscopy with Dr. Bryan Lemma as he had availability tomorrow.  Did provide the patient with a detailed list of risks for the procedures and she agrees to proceed.  She will continue to follow with Dr. Henrene Pastor as her primary GI physician after time of procedures. 3.  Increased Dicyclomine to 20 mg 4 times daily, 20-30 minutes before meals and at bedtime, prescribed #120 with 2 refills. 4.  Discussed the low FODMAP diet.  Provide her with a handout today. 5.  Patient to follow in clinic per recommendations from Dr. Bryan Lemma after time of procedures tomorrow.  She may need to come back in to further discuss IBS if symptoms continue in a month or two.  Ellouise Newer, PA-C [facility] 11/23/2020, 9:45 AM  Cc: Jani Gravel, MD

## 2020-11-24 ENCOUNTER — Ambulatory Visit (AMBULATORY_SURGERY_CENTER): Payer: Self-pay | Admitting: Gastroenterology

## 2020-11-24 ENCOUNTER — Encounter: Payer: Self-pay | Admitting: Gastroenterology

## 2020-11-24 ENCOUNTER — Other Ambulatory Visit: Payer: Self-pay

## 2020-11-24 VITALS — BP 146/83 | HR 69 | Temp 97.7°F | Resp 16 | Ht 63.0 in | Wt 210.0 lb

## 2020-11-24 DIAGNOSIS — K2990 Gastroduodenitis, unspecified, without bleeding: Secondary | ICD-10-CM

## 2020-11-24 DIAGNOSIS — Z800 Family history of malignant neoplasm of digestive organs: Secondary | ICD-10-CM

## 2020-11-24 DIAGNOSIS — K319 Disease of stomach and duodenum, unspecified: Secondary | ICD-10-CM

## 2020-11-24 DIAGNOSIS — R109 Unspecified abdominal pain: Secondary | ICD-10-CM

## 2020-11-24 DIAGNOSIS — K643 Fourth degree hemorrhoids: Secondary | ICD-10-CM

## 2020-11-24 DIAGNOSIS — D120 Benign neoplasm of cecum: Secondary | ICD-10-CM

## 2020-11-24 DIAGNOSIS — R194 Change in bowel habit: Secondary | ICD-10-CM

## 2020-11-24 DIAGNOSIS — R112 Nausea with vomiting, unspecified: Secondary | ICD-10-CM

## 2020-11-24 DIAGNOSIS — K2970 Gastritis, unspecified, without bleeding: Secondary | ICD-10-CM

## 2020-11-24 DIAGNOSIS — Z980 Intestinal bypass and anastomosis status: Secondary | ICD-10-CM

## 2020-11-24 DIAGNOSIS — D125 Benign neoplasm of sigmoid colon: Secondary | ICD-10-CM

## 2020-11-24 DIAGNOSIS — D124 Benign neoplasm of descending colon: Secondary | ICD-10-CM

## 2020-11-24 MED ORDER — SODIUM CHLORIDE 0.9 % IV SOLN: 500.0000 mL | Freq: Once | INTRAVENOUS | Status: DC

## 2020-11-24 NOTE — Progress Notes (Signed)
Report to PACU, RN, vss, BBS= Clear.  

## 2020-11-24 NOTE — Progress Notes (Signed)
Pt's states no medical or surgical changes since previsit or office visit.  [place] vitals and AB IV. 

## 2020-11-24 NOTE — Progress Notes (Signed)
Called to room to assist during endoscopic procedure.  Patient ID and intended procedure confirmed with present staff. Received instructions for my participation in the procedure from the performing physician.  

## 2020-11-24 NOTE — Op Note (Signed)
[facility] Patient Name: Melanie [name] Procedure Date: 11/24/2020 2:36 PM MRN: [id number] Endoscopist: Gerrit Heck , MD Age: 65 Referring MD:  Date of Birth: 1956/07/15 Gender: Female Account #: [id number] Procedure:                Upper GI endoscopy Indications:              Generalized abdominal pain, Diarrhea, Nausea Medicines:                Monitored Anesthesia Care Procedure:                Pre-Anesthesia Assessment:                           - Prior to the procedure, a History and Physical                            was performed, and patient medications and                            allergies were reviewed. The patient's tolerance of                            previous anesthesia was also reviewed. The risks                            and benefits of the procedure and the sedation                            options and risks were discussed with the patient.                            All questions were answered, and informed consent                            was obtained. Prior Anticoagulants: The patient has                            taken no previous anticoagulant or antiplatelet                            agents. ASA Grade Assessment: II - A patient with                            mild systemic disease. After reviewing the risks                            and benefits, the patient was deemed in                            satisfactory condition to undergo the procedure.                           After obtaining informed consent, the endoscope was  passed under direct vision. Throughout the                            procedure, the patient's blood pressure, pulse, and                            oxygen saturations were monitored continuously. The                            Endoscope was introduced through the mouth, and                            advanced to the jejunum. The upper GI endoscopy was                             accomplished without difficulty. The patient                            tolerated the procedure well. Scope In: Scope Out: Findings:                 The examined esophagus was normal.                           Evidence of a patent Billroth II gastrojejunostomy                            was found. The gastric mucosa was characterized by                            erythema and edema. This was biopsied to evaluate                            for Helicobacter pylori. The gastrojejunal                            anastomosis was characterized by healthy appearing                            mucosa. This was traversed. The efferent limb was                            examined. The afferent limb was examined. These                            were each normal appearing. Biopsies were taken                            with a cold forceps for histology. Estimated blood                            loss was minimal. Complications:            No immediate complications. Estimated Blood Loss:     Estimated blood loss was minimal. Impression:               -  Normal esophagus.                           - Gastritis. This was biopsied.                           - Patent Billroth II gastrojejunostomy was found,                            characterized by healthy appearing mucosa. Biopsied. Recommendation:           - Patient has a contact number available for                            emergencies. The signs and symptoms of potential                            delayed complications were discussed with the                            patient. Return to normal activities tomorrow.                            Written discharge instructions were provided to the                            patient.                           - Resume previous diet.                           - Continue present medications.                           - Await pathology results.                           - Colonoscopy today. Gerrit Heck, MD 11/24/2020 3:44:03 PM

## 2020-11-24 NOTE — Patient Instructions (Signed)
Thank you for letting us take care of your healthcare needs today. Please see handouts given to you on Polyps, Hemorrhoids and Gastritis.   YOU HAD AN ENDOSCOPIC PROCEDURE TODAY AT [facility]:   Refer to the procedure report that was given to you for any specific questions about what was found during the examination.  If the procedure report does not answer your questions, please call your gastroenterologist to clarify.  If you requested that your care partner not be given the details of your procedure findings, then the procedure report has been included in a sealed envelope for you to review at your convenience later.  YOU SHOULD EXPECT: Some feelings of bloating in the abdomen. Passage of more gas than usual.  Walking can help get rid of the air that was put into your GI tract during the procedure and reduce the bloating. If you had a lower endoscopy (such as a colonoscopy or flexible sigmoidoscopy) you may notice spotting of blood in your stool or on the toilet paper. If you underwent a bowel prep for your procedure, you may not have a normal bowel movement for a few days.  Please Note:  You might notice some irritation and congestion in your nose or some drainage.  This is from the oxygen used during your procedure.  There is no need for concern and it should clear up in a day or so.  SYMPTOMS TO REPORT IMMEDIATELY:   Following lower endoscopy (colonoscopy or flexible sigmoidoscopy):  Excessive amounts of blood in the stool  Significant tenderness or worsening of abdominal pains  Swelling of the abdomen that is new, acute  Fever of 100F or higher   For urgent or emergent issues, a gastroenterologist can be reached at any hour by calling [phone]. Do not use MyChart messaging for urgent concerns.    DIET:  We do recommend a small meal at first, but then you may proceed to your regular diet.  Drink plenty of fluids but you should avoid alcoholic beverages for 24  hours.  ACTIVITY:  You should plan to take it easy for the rest of today and you should NOT DRIVE or use heavy machinery until tomorrow (because of the sedation medicines used during the test).    FOLLOW UP: Our staff will call the number listed on your records 48-72 hours following your procedure to check on you and address any questions or concerns that you may have regarding the information given to you following your procedure. If we do not reach you, we will leave a message.  We will attempt to reach you two times.  During this call, we will ask if you have developed any symptoms of COVID 19. If you develop any symptoms (ie: fever, flu-like symptoms, shortness of breath, cough etc.) before then, please call [phone].  If you test positive for Covid 19 in the 2 weeks post procedure, please call and report this information to [place].    If any biopsies were taken you will be contacted by phone or by letter within the next 1-3 weeks.  Please call us at [phone] if you have not heard about the biopsies in 3 weeks.    SIGNATURES/CONFIDENTIALITY: You and/or your care partner have signed paperwork which will be entered into your electronic medical record.  These signatures attest to the fact that that the information above on your After Visit Summary has been reviewed and is understood.  Full responsibility of the confidentiality of this discharge  information lies with you and/or your care-partner. 

## 2020-11-24 NOTE — Op Note (Signed)
[facility] Patient Name: Melanie [name] Procedure Date: 11/24/2020 2:29 PM MRN: [id number] Endoscopist: Gerrit Heck , MD Age: 65 Referring MD:  Date of Birth: January 21, 1956 Gender: Female Account #: [id number] Procedure:                Colonoscopy Indications:              Generalized abdominal pain, left sided abdominal                            pain, Change in bowel habits, Diarrhea                           Family history of colon cancer in first degree                            relative. Medicines:                Monitored Anesthesia Care Procedure:                Pre-Anesthesia Assessment:                           - Prior to the procedure, a History and Physical                            was performed, and patient medications and                            allergies were reviewed. The patient's tolerance of                            previous anesthesia was also reviewed. The risks                            and benefits of the procedure and the sedation                            options and risks were discussed with the patient.                            All questions were answered, and informed consent                            was obtained. Prior Anticoagulants: The patient has                            taken no previous anticoagulant or antiplatelet                            agents. ASA Grade Assessment: II - A patient with                            mild systemic disease. After reviewing the risks  and benefits, the patient was deemed in                            satisfactory condition to undergo the procedure.                           After obtaining informed consent, the colonoscope                            was passed under direct vision. Throughout the                            procedure, the patient's blood pressure, pulse, and                            oxygen saturations were monitored continuously. The                             Olympus CF-[id number] was introduced                            through the anus and advanced to the the cecum,                            identified by appendiceal orifice and ileocecal                            valve. The colonoscopy was performed without                            difficulty. The patient tolerated the procedure                            well. The quality of the bowel preparation was                            fair. The ileocecal valve, appendiceal orifice, and                            rectum were photographed. Scope In: 2:56:26 PM Scope Out: 3:33:19 PM Scope Withdrawal Time: 0 hours 23 minutes 29 seconds  Total Procedure Duration: 0 hours 36 minutes 53 seconds  Findings:                 Hemorrhoids were found on perianal exam.                           Three sessile polyps were found in the sigmoid                            colon, descending colon and cecum. The polyps were                            3 to 5 mm in size. These polyps were removed with a  cold snare. Resection and retrieval were complete.                            Estimated blood loss was minimal.                           A moderate amount of semi-liquid stool was found in                            the entire colon. Lavage of the area was performed                            using copious amounts of tap water, resulting in                            clearance with good visualization.                           Normal mucosa was found in the entire colon.                            Biopsies for histology were taken with a cold                            forceps from the right colon and left colon for                            evaluation of microscopic colitis. Estimated blood                            loss was minimal.                           The sigmoid colon and ascending colon revealed                            significantly excessive  looping. Advancing the                            scope required using manual pressure.                           Non-bleeding internal hemorrhoids were found during                            retroflexion. The hemorrhoids were large and Grade                            IV (internal hemorrhoids that prolapse and cannot                            be reduced manually). Complications:            No immediate complications. Estimated Blood Loss:     Estimated blood loss was minimal. Impression:               -  Preparation of the colon was fair.                           - Hemorrhoids found on perianal exam.                           - Three 3 to 5 mm polyps in the sigmoid colon, in                            the descending colon and in the cecum, removed with                            a cold snare. Resected and retrieved.                           - Stool in the entire examined colon.                           - Normal mucosa in the entire examined colon.                            Biopsied.                           - There was significant looping of the colon.                           - Non-bleeding internal hemorrhoids. Recommendation:           - Patient has a contact number available for                            emergencies. The signs and symptoms of potential                            delayed complications were discussed with the                            patient. Return to normal activities tomorrow.                            Written discharge instructions were provided to the                            patient.                           - Resume previous diet.                           - Continue present medications.                           - Await pathology results.                           -  Repeat colonoscopy in 3 - 5 years for                            surveillance based on pathology results.                           - Use fiber, for example Citrucel, Fibercon, Konsyl                             or Metamucil.                           - Refer to a colo-rectal surgeon at appointment to                            be scheduled for evaluation and treatment of large                            prolapsing hemorrhoids. Gerrit Heck, MD 11/24/2020 3:50:56 PM

## 2020-11-25 ENCOUNTER — Telehealth: Payer: Self-pay

## 2020-11-25 NOTE — Telephone Encounter (Signed)
Referral, records, demographic, and insurance information faxed to [facility].

## 2020-11-25 NOTE — Telephone Encounter (Signed)
-----   Message from [place], DO sent at 11/24/2020  3:57 PM EDT ----- Can you please send referral to Colorectal surgery for symptomatic Grade 4 hemorrhoids? Thanks

## 2020-11-26 ENCOUNTER — Telehealth: Payer: Self-pay

## 2020-11-26 NOTE — Telephone Encounter (Signed)
NO ANSWER, MESSAGE LEFT FOR PATIENT. 

## 2020-11-26 NOTE — Telephone Encounter (Signed)
  Follow up Call-  Call back number 11/24/2020 03/04/2018  Post procedure Call Back phone  # [phone]  Permission to leave phone message Yes Yes  Some recent data might be hidden     Patient questions:  Do you have a fever, pain , or abdominal swelling? No. Pain Score  0 *  Have you tolerated food without any problems? Yes.    Have you been able to return to your normal activities? Yes.    Do you have any questions about your discharge instructions: Diet   No. Medications  No. Follow up visit  No.  Do you have questions or concerns about your Care? No.  Actions: * If pain score is 4 or above: No action needed, pain <4.  Have you developed a fever since your procedure? No 2.   Have you had an respiratory symptoms (SOB or cough) since your procedure? No  3.   Have you tested positive for COVID 19 since your procedure No  4.   Have you had any family members/close contacts diagnosed with the COVID 19 since your procedure?  No   If yes to any of these questions please route to Joylene John, RN and Joella Prince, RN

## 2020-12-09 ENCOUNTER — Encounter: Payer: Self-pay | Admitting: Gastroenterology

## 2021-01-03 ENCOUNTER — Ambulatory Visit: Payer: Self-pay | Admitting: Surgery

## 2021-01-03 NOTE — H&P (Signed)
Melanie [name] Appointment: 01/03/2021 8:45 AM Location: [facility] Patient #: [id number] DOB: [birth year] Divorced / Language: Cleophus Molt / Race: White Female  History of Present Illness Adin Hector MD; 01/03/2021 9:23 AM) The patient is a 65 year old female who presents with hemorrhoids. Note for "Hemorrhoids": ` ` ` Patient sent for surgical consultation at the request of Dr Bryan Lemma  Chief Complaint: Worsening hemorrhoids. ` ` The patient is a pleasant woman with a history of many gastric bypass and cholecystectomy.  Has a regular bowels that she believes is due to irritable bowel syndrome.  Seen to be mainly diarrhea focus but occasionally gets some intermittent bouts of constipation.  He goes about 4-6 times a day.  Family history of colon cancer with some worsening bleeding.  She thought maybe was limited to her worsening hemorrhoids that she's had for decades.  However she was concerned.  She followed up with gastroenterology.  They did upper and lower endoscopy.  Biopsies did not show any inflammatory bowel disease nor any adenomas or cancers.  However she did have obviously large grade 4 hemorrhoids.  Surgical consultation offered since it seemed to be the most likely source of bleeding as well as with discomfort.  Not felt to be a good endoscopic banding candidate.  She comes in today by herself.  She notes that she is getting more episodes of bleeding which concerned her.  She is offering have him wear a pad.  She denies any cardiac or pulmonary issues.  She does have fibromyalgia with chronic pain.  She takes intermittent Flexeril but no narcotics.  She snores at night but denies any definite diagnosis of sleep apnea.  No diabetes.  She can walk a half hour without difficulty.  Prior surgeries include laparoscopic many gastric bypass and [place], cholecystectomy, C-sections 2.  So she's also had bilateral salpingo-oophorectomy and lysis of adhesions converting to open  and 2009 by Dr. Radene Knee  (Review of systems as stated in this history (HPI) or in the review of systems.  Otherwise all other 12 point ROS are negative) ` ` ###########################################`  This patient encounter took 35 minutes today to perform the following: obtain history, perform exam, review outside records, interpret tests & imaging, counsel the patient on their diagnosis; and, document this encounter, including findings & plan in the electronic health record (EHR).   Past Surgical History (Charmella Little, CNA; 01/03/2021 8:36 AM) Breast Biopsy  Right. multiple Cesarean Section - Multiple  Colon Polyp Removal - Colonoscopy  Gastric Bypass  Hysterectomy (not due to cancer) - Complete  Knee Surgery  Bilateral. Tonsillectomy   Diagnostic Studies History (Charmella Little, CNA; 01/03/2021 8:36 AM) Mammogram  within last year Pap Smear  >5 years ago  Allergies (Charmella Little, CNA; 01/03/2021 8:38 AM) Penicillin G Pot in Dextrose *PENICILLINS*  Gabapentin *ANTICONVULSANTS*   Medication History (Charmella Little, CNA; 01/03/2021 8:38 AM) Dicyclomine HCl  (20MG  Tablet, Oral) Active. Irbesartan  (150MG  Tablet, Oral) Active. PARoxetine HCl  (20MG  Tablet, Oral) Active.  Social History (Charmella Little, CNA; 01/03/2021 8:36 AM) Alcohol use  Occasional alcohol use. Caffeine use  Carbonated beverages, Coffee. No drug use  Tobacco use  Former smoker.  Family History (Charmella Little, CNA; 01/03/2021 8:36 AM) Alcohol Abuse  Father. Arthritis  Mother. Cerebrovascular Accident  Mother. Colon Cancer  Mother. Heart disease in female family member before age 24  Hypertension  Mother.  Pregnancy / Birth History (Charmella Little, CNA; 01/03/2021 8:36 AM) Age at menarche  51 years.  Age of menopause  59-50 Gravida  2 Maternal age  73-25 Para  2  Other Problems (Charmella Little, CNA; 01/03/2021 8:36 AM) Anxiety Disorder  Depression  General anesthesia - complications   Hemorrhoids  High blood pressure  Oophorectomy      Review of Systems ([place] CNA; 01/03/2021 8:36 AM) General Not Present- Appetite Loss, Chills, Fatigue, Fever, Night Sweats, Weight Gain and Weight Loss. HEENT Present- Wears glasses/contact lenses. Not Present- Earache, Hearing Loss, Hoarseness, Nose Bleed, Oral Ulcers, Ringing in the Ears, Seasonal Allergies, Sinus Pain, Sore Throat, Visual Disturbances and Yellow Eyes. Respiratory Not Present- Bloody sputum, Chronic Cough, Difficulty Breathing, Snoring and Wheezing. Cardiovascular Not Present- Chest Pain, Difficulty Breathing Lying Down, Leg Cramps, Palpitations, Rapid Heart Rate, Shortness of Breath and Swelling of Extremities. Gastrointestinal Present- Hemorrhoids. Not Present- Abdominal Pain, Bloating, Bloody Stool, Change in Bowel Habits, Chronic diarrhea, Constipation, Difficulty Swallowing, Excessive gas, Gets full quickly at meals, Indigestion, Nausea, Rectal Pain and Vomiting. Female Genitourinary Not Present- Frequency, Nocturia, Painful Urination, Pelvic Pain and Urgency. Musculoskeletal Present- Joint Pain and Joint Stiffness. Not Present- Back Pain, Muscle Pain, Muscle Weakness and Swelling of Extremities. Neurological Not Present- Decreased Memory, Fainting, Headaches, Numbness, Seizures, Tingling, Tremor, Trouble walking and Weakness. Psychiatric Present- Depression. Not Present- Anxiety, Bipolar, Change in Sleep Pattern, Fearful and Frequent crying.  Vitals (Charmella Little CNA; 01/03/2021 8:38 AM) 01/03/2021 8:38 AM Weight: 210.13 lb   Height: 64 in  Body Surface Area: 2 m   Body Mass Index: 36.07 kg/m   Temp.: 96.3 F    Pulse: 93 (Regular)    P.OX: 96% (Room air) BP: 160/82(Sitting, Left Arm, Standard)        Physical Exam Adin Hector MD; 01/03/2021 9:12 AM)  General Mental Status - Alert. General Appearance - Not in acute distress, Not Sickly. Orientation - Oriented X3. Hydration - Well  hydrated. Voice - Normal.  Integumentary Global Assessment Upon inspection and palpation of skin surfaces of the - Axillae: non-tender, no inflammation or ulceration, no drainage. and Distribution of scalp and body hair is normal. General Characteristics Temperature - normal warmth is noted.  Head and Neck Head - normocephalic, atraumatic with no lesions or palpable masses. Face Global Assessment - atraumatic, no absence of expression. Neck Global Assessment - no abnormal movements, no bruit auscultated on the right, no bruit auscultated on the left, no decreased range of motion, non-tender. Trachea - midline. Thyroid Gland Characteristics - non-tender.  Eye Eyeball - Left - Extraocular movements intact, No Nystagmus - Left. Eyeball - Right - Extraocular movements intact, No Nystagmus - Right. Cornea - Left - No Hazy - Left. Cornea - Right - No Hazy - Right. Sclera/Conjunctiva - Left - No scleral icterus, No Discharge - Left. Sclera/Conjunctiva - Right - No scleral icterus, No Discharge - Right. Pupil - Left - Direct reaction to light normal. Pupil - Right - Direct reaction to light normal.  ENMT Ears Pinna - Left - no drainage observed, no generalized tenderness observed. Pinna - Right - no drainage observed, no generalized tenderness observed. Nose and Sinuses External Inspection of the Nose - no destructive lesion observed. Inspection of the nares - Left - quiet respiration. Inspection of the nares - Right - quiet respiration. Mouth and Throat Lips - Upper Lip - no fissures observed, no pallor noted. Lower Lip - no fissures observed, no pallor noted. Nasopharynx - no discharge present. Oral Cavity/Oropharynx - Tongue - no dryness observed. Oral Mucosa - no cyanosis observed. Hypopharynx -  no evidence of airway distress observed.  Chest and Lung Exam Inspection Movements - Normal and Symmetrical. Accessory muscles - No use of accessory muscles in  breathing. Palpation Palpation of the chest reveals - Non-tender. Auscultation Breath sounds - Normal and Clear.  Cardiovascular Auscultation Rhythm - Regular. Murmurs & Other Heart Sounds - Auscultation of the heart reveals - No Murmurs and No Systolic Clicks.  Abdomen Inspection Inspection of the abdomen reveals - No Visible peristalsis and No Abnormal pulsations. Umbilicus - No Bleeding, No Urine drainage. Palpation/Percussion Palpation and Percussion of the abdomen reveal - Soft, Non Tender, No Rebound tenderness, No Rigidity (guarding) and No Cutaneous hyperesthesia. Note:  Abdomen Not severely distended.  Right subcostal incisions consistent with prior open cholecystectomy. Laparoscopic port sites. Perhaps small supraumbilical hernia but nontender. Mild diastases recti. Obese but soft.  Female Genitourinary Sexual Maturity Tanner 5 - Adult hair pattern. Note:  No vaginal bleeding nor discharge  Rectal Note:  Please refer to anoscopy.  Enlarged internal hemorrhoids grade 2-4. Left lateral greater than right posterior greater than right anterior. Thicken irritated and friable.  Peripheral Vascular Upper Extremity Inspection - Left - No Cyanotic nailbeds - Left, Not Ischemic. Inspection - Right - No Cyanotic nailbeds - Right, Not Ischemic.  Neurologic Neurologic evaluation reveals  - normal attention span and ability to concentrate, able to name objects and repeat phrases. Appropriate fund of knowledge , normal sensation and normal coordination. Mental Status Affect - not angry, not paranoid. Cranial Nerves - Normal Bilaterally. Gait - Normal.  Neuropsychiatric Mental status exam performed with findings of - able to articulate well with normal speech/language, rate, volume and coherence, thought content normal with ability to perform basic computations and apply abstract reasoning and no evidence of hallucinations, delusions, obsessions or homicidal/suicidal  ideation.  Musculoskeletal Global Assessment Spine, Ribs and Pelvis - no instability, subluxation or laxity. Right Upper Extremity - no instability, subluxation or laxity.  Lymphatic Head & Neck  General Head & Neck Lymphatics: Bilateral - Description - No Localized lymphadenopathy. Axillary  General Axillary Region: Bilateral - Description - No Localized lymphadenopathy. Femoral & Inguinal  Generalized Femoral & Inguinal Lymphatics: Left - Description - No Localized lymphadenopathy. Right - Description - No Localized lymphadenopathy.   Results Adin Hector MD; 01/03/2021 9:22 AM) Procedures  Name Value Date Hemorrhoids Procedure   Internal exam:   prolapse   Internal Hemorroids ( non-bleeding)   Other: ##################..........Marland KitchenMarland KitchenLeft lateral partially prolapsed hemorrhoids enlarged.  Borderline grade 4...........Marland KitchenPerianal skin clean with good hygiene.  No pruritis ani.  No pilonidal disease.  No fissure.  No abscess/fistula.  Normal sphincter tone.  ....Marland KitchenMarland KitchenNo condyloma warts............. Tolerates digital and anoscopic rectal exam. No rectal masses.  Hemorrhoidal piles enlarged at least grade 2.  Chronically thickened and irritated.  Friable.  Right posterior grade 3.  Right anterior probable mildest but still inflamed a grade 2.....Marland KitchenMarland KitchenExam done with assistance of female Medical Assistant in the room.  Performed: 01/03/2021 9:06 AM    Assessment & Plan Adin Hector MD; 01/03/2021 9:22 AM)  PROLAPSED INTERNAL HEMORRHOIDS, GRADE 4 (K64.3) Impression: Pleasant woman struggling with worsening hemorrhoids especially the past several years conservative with her her regular bowels related to prior mini gastric bypass, cholecystectomy, probable irritable bowel syndrome diarrhea dominant.  Strongly recommended working with gastrology with a bowel regimen most likely combination of fiber, antispasmodics/antidiarrheals. Try and slow her bowels down to a more  regular range of less than 3 a day. That will help the hemorrhoids from worsening  prevent future flares.  Because they are prolapsing out, thickened, and sensitive; I do not think she is a good candidate for banding. I offered outpatient surgery. Hemorrhoidal ligation pexy with hemorrhoidectomy of barges piles. She is interested in proceeding. We will work to coordinate a convenient time  The anatomy & physiology of the anorectal region was discussed. The pathophysiology of hemorrhoids and differential diagnosis was discussed. Natural history progression was discussed. I stressed the importance of a bowel regimen to have daily soft bowel movements to minimize progression of disease. Goal of one BM / day ideal. Use of wet wipes, warm baths, avoiding straining, etc were emphasized.  Educational handouts further explaining the pathology, treatment options, and bowel regimen were given as well. The patient expressed understanding.  Current Plans You are being scheduled for surgery - Our schedulers will call you.   You should hear from our office's scheduling department within 5 working days about the location, date, and time of surgery.  We try to make accommodations for patient's preferences in scheduling surgery, but sometimes the OR schedule or the surgeon's schedule prevents [place] from making those accommodations.  If you have not heard from our office [phone]) in 5 working days, call the office and ask for your surgeon's nurse.  If you have other questions about your diagnosis, plan, or surgery, call the office and ask for your surgeon's nurse.  ANOSCOPY, DIAGNOSTIC ([id number]) Pt Education - Pamphlet Given - The Hemorrhoid Book: discussed with patient and provided information. Pt Education - [facility] Hemorrhoids (Nik Gorrell): discussed with patient and provided information.  ENCOUNTER FOR PREOPERATIVE EXAMINATION FOR GENERAL SURGICAL PROCEDURE (Z01.818)  Current Plans You are being scheduled for  surgery - Our schedulers will call you.   You should hear from our office's scheduling department within 5 working days about the location, date, and time of surgery.  We try to make accommodations for patient's preferences in scheduling surgery, but sometimes the OR schedule or the surgeon's schedule prevents [place] from making those accommodations.  If you have not heard from our office [phone]) in 5 working days, call the office and ask for your surgeon's nurse.  If you have other questions about your diagnosis, plan, or surgery, call the office and ask for your surgeon's nurse.  Pt Education - [facility] Rectal Prep for Anorectal outpatient/office surgery: discussed with patient and provided information. Pt Education - [facility] Rectal Surgery HCI (Greg Cratty): discussed with patient and provided information. The anatomy & physiology of the anorectal region was discussed.  The pathophysiology of hemorrhoids and differential diagnosis was discussed.  Natural history risks without surgery was discussed.   I stressed the importance of a bowel regimen to have daily soft bowel movements to minimize progression of disease.  Interventions such as sclerotherapy & banding were discussed.  The patient's symptoms are not adequately controlled by medicines and other non-operative treatments.  I feel the risks & problems of no surgery outweigh the operative risks; therefore, I recommended surgery to treat the hemorrhoids by ligation, pexy, and possible resection.   Risks such as bleeding, infection, urinary difficulties, need for further treatment, heart attack, death, and other risks were discussed.   I noted a good likelihood this will help address the problem.  Goals of post-operative recovery were discussed as well.  Possibility that this will not correct all symptoms was explained.  Post-operative pain, bleeding, constipation, and other problems after surgery were discussed.  We will work to minimize complications.    Educational handouts further explaining the  pathology, treatment options, and bowel regimen were given as well.  Questions were answered.  The patient expresses understanding & wishes to proceed with surgery.   IRRITABLE BOWEL SYNDROME WITH DIARRHEA (K58.0) Impression: I strongly recommend she work with gastrology to help slow down her bowels into a more normal range to prevent future issues including future hemorrhoid flares.  Current Plans Pt Education - [facility] IBS patient info: discussed with patient and provided information. Pt Education - [facility] Good Bowel Health (Maddelynn Moosman)  HISTORY OF GASTRIC BYPASS (Z98.84)  Adin Hector, MD, FACS, MASCRS Esophageal, Gastrointestinal & Colorectal Surgery Robotic and Minimally Invasive Surgery  [facility] [address], [place], [place] [address] [phone] Fax [phone] Main/Paging  CONTACT INFORMATION: Weekday (9AM-5PM) concerns: Call [facility] main office at [phone] Weeknight (5PM-9AM) or Weekend/Holiday concerns: Check [email or web address] for General Surgery [facility] coverage (Please, do not use SecureChat as it is not reliable communication to operating surgeons for immediate patient care)

## 2021-01-11 ENCOUNTER — Other Ambulatory Visit: Payer: Self-pay | Admitting: Family Medicine

## 2021-01-11 DIAGNOSIS — I10 Essential (primary) hypertension: Secondary | ICD-10-CM

## 2021-04-09 ENCOUNTER — Encounter (HOSPITAL_COMMUNITY): Payer: Self-pay

## 2021-04-09 ENCOUNTER — Other Ambulatory Visit: Payer: Self-pay

## 2021-04-09 ENCOUNTER — Emergency Department (HOSPITAL_COMMUNITY): Payer: Medicare Other

## 2021-04-09 ENCOUNTER — Emergency Department (HOSPITAL_COMMUNITY)
Admission: EM | Admit: 2021-04-09 | Discharge: 2021-04-09 | Disposition: A | Discharge status: Home / Self Care | Payer: Medicare Other | Attending: Emergency Medicine | Admitting: Emergency Medicine

## 2021-04-09 DIAGNOSIS — M545 Low back pain, unspecified: Secondary | ICD-10-CM | POA: Diagnosis not present

## 2021-04-09 DIAGNOSIS — I10 Essential (primary) hypertension: Secondary | ICD-10-CM | POA: Insufficient documentation

## 2021-04-09 DIAGNOSIS — R109 Unspecified abdominal pain: Secondary | ICD-10-CM | POA: Insufficient documentation

## 2021-04-09 DIAGNOSIS — Z20822 Contact with and (suspected) exposure to covid-19: Secondary | ICD-10-CM | POA: Insufficient documentation

## 2021-04-09 DIAGNOSIS — Z79899 Other long term (current) drug therapy: Secondary | ICD-10-CM | POA: Insufficient documentation

## 2021-04-09 DIAGNOSIS — R509 Fever, unspecified: Secondary | ICD-10-CM | POA: Diagnosis not present

## 2021-04-09 DIAGNOSIS — Z87891 Personal history of nicotine dependence: Secondary | ICD-10-CM | POA: Insufficient documentation

## 2021-04-09 DIAGNOSIS — Z96653 Presence of artificial knee joint, bilateral: Secondary | ICD-10-CM | POA: Insufficient documentation

## 2021-04-09 LAB — URINALYSIS, ROUTINE W REFLEX MICROSCOPIC
Bacteria, UA: NONE SEEN
Bilirubin Urine: NEGATIVE
Glucose, UA: NEGATIVE mg/dL
Ketones, ur: NEGATIVE mg/dL
Leukocytes,Ua: NEGATIVE
Nitrite: NEGATIVE
Protein, ur: NEGATIVE mg/dL
Specific Gravity, Urine: 1.012 (ref 1.005–1.030)
pH: 6 (ref 5.0–8.0)

## 2021-04-09 LAB — CBC WITH DIFFERENTIAL/PLATELET
Abs Immature Granulocytes: 0.02 10*3/uL (ref 0.00–0.07)
Basophils Absolute: 0 10*3/uL (ref 0.0–0.1)
Basophils Relative: 1 %
Eosinophils Absolute: 0.2 10*3/uL (ref 0.0–0.5)
Eosinophils Relative: 2 %
HCT: 33.4 % — ABNORMAL LOW (ref 36.0–46.0)
Hemoglobin: 10.5 g/dL — ABNORMAL LOW (ref 12.0–15.0)
Immature Granulocytes: 0 %
Lymphocytes Relative: 19 %
Lymphs Abs: 1.6 10*3/uL (ref 0.7–4.0)
MCH: 26.2 pg (ref 26.0–34.0)
MCHC: 31.4 g/dL (ref 30.0–36.0)
MCV: 83.3 fL (ref 80.0–100.0)
Monocytes Absolute: 0.8 10*3/uL (ref 0.1–1.0)
Monocytes Relative: 10 %
Neutro Abs: 5.7 10*3/uL (ref 1.7–7.7)
Neutrophils Relative %: 68 %
Platelets: 247 10*3/uL (ref 150–400)
RBC: 4.01 MIL/uL (ref 3.87–5.11)
RDW: 14.6 % (ref 11.5–15.5)
WBC: 8.3 10*3/uL (ref 4.0–10.5)
nRBC: 0 % (ref 0.0–0.2)

## 2021-04-09 LAB — BASIC METABOLIC PANEL
Anion gap: 5 (ref 5–15)
BUN: 21 mg/dL (ref 8–23)
CO2: 26 mmol/L (ref 22–32)
Calcium: 8.5 mg/dL — ABNORMAL LOW (ref 8.9–10.3)
Chloride: 108 mmol/L (ref 98–111)
Creatinine, Ser: 1.01 mg/dL — ABNORMAL HIGH (ref 0.44–1.00)
GFR, Estimated: >60 mL/min (ref >60)
Glucose, Bld: 114 mg/dL — ABNORMAL HIGH (ref 70–99)
Potassium: 4.1 mmol/L (ref 3.5–5.1)
Sodium: 139 mmol/L (ref 135–145)

## 2021-04-09 MED ORDER — ACETAMINOPHEN 325 MG PO TABS: 650.0000 mg | ORAL_TABLET | Freq: Four times a day (QID) | ORAL | Status: DC | PRN

## 2021-04-09 MED ORDER — SODIUM CHLORIDE 0.9 % IV BOLUS
500.0000 mL | Freq: Once | INTRAVENOUS | Status: AC
  Administered 2021-04-09: 500 mL via INTRAVENOUS

## 2021-04-09 NOTE — Discharge Instructions (Addendum)
Tylenol every 4 hours for fever, drink plenty of fluids.  See your Physicain for recheck on Monday Check your My chart for covid and flu results

## 2021-04-09 NOTE — ED Triage Notes (Signed)
Pt presents to ED with left flank pain that started about a week ago, pt saw her dr and urinalysis showed crystals, couldn't rule out kidney stone, pt started running fever today and was told to come to ED

## 2021-04-09 NOTE — ED Notes (Signed)
Pt reports taking 1000mg  Tylenol PTA @ apprx 1600 today.

## 2021-04-09 NOTE — ED Provider Notes (Signed)
[facility] EMERGENCY DEPARTMENT Provider Note   CSN: [id number] Arrival date & time: 04/09/21  1857     History Chief Complaint  Patient presents with   Flank Pain    Melanie [name] is a 65 y.o. female.  No language interpreter was used.  Flank Pain This is a new problem. The current episode started more than 2 days ago. The problem occurs constantly. The problem has been gradually worsening. Nothing aggravates the symptoms. Nothing relieves the symptoms. She has tried nothing for the symptoms. The treatment provided no relief.  Pt reports she has had pain in left side of back.  Pt reports today she has a fever.      Past Medical History:  Diagnosis Date   Arthritis    Barrett's esophagus    RESOLVED AS PER LAST ENDOSCOPY    Depression    Fibromyalgia    Gallstones    RESOLVED WITH REMOVAL OF GALLBLADDER    Hypertension    DX WHEN SHE WEIGHED OVER 300LB , LOST WEIGHT WITH AID OF BARIATRIC SURGERY , NOW  NO LONGER TAKES BP MEDICATION    Obesity    Pernicious anemia    RESOLVED    PONV (postoperative nausea and vomiting)     Patient Active Problem List   Diagnosis Date Noted   OA (osteoarthritis) of knee 04/07/2019   Severe major depression ([place]) 05/18/2015   Memory impairment 05/18/2015   Osteoarthritis of both knees 05/18/2015   Healthcare maintenance 05/18/2015   Family history of colon cancer 03/11/2012   Fibromyalgia 10/03/2007   Vitamin D deficiency 02/04/2007   Vitamin B 12 deficiency 02/03/2007   ANXIETY STATE NEC 02/03/2007   BARRETT'S ESOPHAGUS 02/03/2007   Essential hypertension 12/13/2006   GERD 12/13/2006   HIATAL HERNIA 12/13/2006    Past Surgical History:  Procedure Laterality Date   APPENDECTOMY     BARIATRIC SURGERY     BREAST CYST EXCISION Right    x 2   CESAREAN SECTION     x 2   CHOLECYSTECTOMY     ovaries removed     ROTATOR CUFF REPAIR Left    TONSILLECTOMY     TOTAL KNEE ARTHROPLASTY Left    TOTAL KNEE ARTHROPLASTY Right  04/07/2019   Procedure: TOTAL KNEE ARTHROPLASTY;  Surgeon: Gaynelle Arabian, MD;  Location: [facility];  Service: Orthopedics;  Laterality: Right;  42min   VAGINAL HYSTERECTOMY       OB History   No obstetric history on file.     Family History  Problem Relation Age of Onset   Colon cancer Mother    Heart disease Mother    Hypertension Mother    Stroke Mother    Skin cancer Mother    Colon cancer Paternal Aunt    Breast cancer Paternal Aunt    Lung cancer Paternal Aunt    Colon cancer Other        mat. cousin   Skin cancer Other        mother whole fam   Esophageal cancer Father    Rectal cancer Neg Hx    Stomach cancer Neg Hx     Social History   Tobacco Use   Smoking status: Former    Types: Cigarettes    Quit date: 03/11/1993    Years since quitting: 28.0   Smokeless tobacco: Never  Vaping Use   Vaping Use: Never used  Substance Use Topics   Alcohol use: Yes    Comment: rarely  Drug use: No    Comment: "years ago"    Home Medications Prior to Admission medications   Medication Sig Start Date End Date Taking? Authorizing Provider  Cholecalciferol (VITAMIN D3) 125 MCG (5000 UT) CAPS Take 15,000 Units by mouth daily.    [provider]  dicyclomine (BENTYL) 20 MG tablet Take 1 tablet (20 mg total) by mouth in the morning, at noon, in the evening, and at bedtime. 11/23/20   Levin Erp, PA  ferrous sulfate 325 (65 FE) MG EC tablet Take 325 mg by mouth daily.    [provider]  irbesartan (AVAPRO) 150 MG tablet Take 150 mg by mouth every morning. 12/16/19   [provider]  PARoxetine (PAXIL) 20 MG tablet Take 20 mg by mouth every morning. 12/16/19   [provider]  promethazine (PHENERGAN) 25 MG tablet Take 12.5 mg by mouth daily as needed for nausea or vomiting.     [provider]  traZODone (DESYREL) 150 MG tablet Take 1 tablet (150 mg total) by mouth at bedtime. 08/04/15   Burns, Arloa Koh, MD    Allergies    Ace  inhibitors, Nsaids, Penicillins, and Sulfa antibiotics  Review of Systems   Review of Systems  Genitourinary:  Positive for flank pain.  All other systems reviewed and are negative.  Physical Exam Updated Vital Signs BP (!) 162/92 (BP Location: Left Arm)   Pulse 82   Temp 99.7 F (37.6 C)   Resp 18   Ht 5\' 3"  (1.6 m)   Wt 95.7 kg   SpO2 95%   BMI 37.38 kg/m   Physical Exam Vitals reviewed.  Constitutional:      Appearance: Normal appearance.  Cardiovascular:     Rate and Rhythm: Normal rate.  Pulmonary:     Effort: Pulmonary effort is normal.  Abdominal:     General: Abdomen is flat.  Musculoskeletal:        General: Normal range of motion.  Skin:    General: Skin is warm.  Neurological:     General: No focal deficit present.     Mental Status: She is alert.  Psychiatric:        Mood and Affect: Mood normal.    ED Results / Procedures / Treatments   Labs (all labs ordered are listed, but only abnormal results are displayed) Labs Reviewed  URINALYSIS, ROUTINE W REFLEX MICROSCOPIC - Abnormal; Notable for the following components:      Result Value   Color, Urine STRAW (*)    Hgb urine dipstick SMALL (*)    All other components within normal limits  CBC WITH DIFFERENTIAL/PLATELET - Abnormal; Notable for the following components:   Hemoglobin 10.5 (*)    HCT 33.4 (*)    All other components within normal limits  BASIC METABOLIC PANEL - Abnormal; Notable for the following components:   Glucose, Bld 114 (*)    Creatinine, Ser 1.01 (*)    Calcium 8.5 (*)    All other components within normal limits  URINE CULTURE  RESP PANEL BY RT-PCR (FLU A&B, COVID) [id number]    EKG None  Radiology DG Chest Port 1 View  Result Date: 04/09/2021 CLINICAL DATA:  Pain.  Flank pain. EXAM: PORTABLE CHEST 1 VIEW COMPARISON:  Chest x-ray 01/12/2020, CT chest 03/04/2012 FINDINGS: The heart and mediastinal contours are unchanged. Aortic calcification. No focal consolidation. No  pulmonary edema. No pleural effusion. No pneumothorax. No acute osseous abnormality.  Surgical clips within the abdomen.  IMPRESSION: No active disease. Electronically Signed   By: Iven Finn M.D.   On: 04/09/2021 22:26   CT Renal Stone Study  Result Date: 04/09/2021 CLINICAL DATA:  Flank pain, kidney stone suspected. Left flank pain, crystalluria EXAM: CT ABDOMEN AND PELVIS WITHOUT CONTRAST TECHNIQUE: Multidetector CT imaging of the abdomen and pelvis was performed following the standard protocol without IV contrast. COMPARISON:  None. FINDINGS: Lower chest: No acute abnormality. Hepatobiliary: No focal liver abnormality is seen. Status post cholecystectomy. No biliary dilatation. Pancreas: Unremarkable Spleen: Unremarkable Adrenals/Urinary Tract: Adrenal glands are unremarkable. Kidneys are normal, without renal calculi, focal lesion, or hydronephrosis. Bladder is unremarkable. Stomach/Bowel: Surgical changes of Omega loop gastric bypass are identified with a moderate sized residual gastric pouch. Redundant sigmoid colon. Moderate stool seen throughout the colon without evidence of obstruction. The small and large bowel are otherwise unremarkable. No evidence of focal inflammation. No free intraperitoneal gas or fluid. Appendix normal. Vascular/Lymphatic: Minimal atherosclerotic calcification within the abdominal aorta. No aortic aneurysm. No pathologic adenopathy within the abdomen and pelvis. Reproductive: Status post hysterectomy. No adnexal masses. Other: No abdominal wall hernia.  Rectum unremarkable. Musculoskeletal: No acute bone abnormality. Degenerative changes are seen within the lumbar spine. No lytic or blastic bone lesion. IMPRESSION: No acute intra-abdominal pathology identified. No definite radiographic explanation for the patient's reported symptoms. Normal noncontrast examination of the kidneys and renal collecting system. Surgical changes of Omega loop gastric bypass with moderate sized  residual gastric pouch. Aortic Atherosclerosis (ICD10-I70.0). Electronically Signed   By: Fidela Salisbury M.D.   On: 04/09/2021 21:48    Procedures Procedures   Medications Ordered in ED Medications  acetaminophen (TYLENOL) tablet 650 mg (has no administration in time range)  sodium chloride 0.9 % bolus 500 mL (0 mLs Intravenous Stopped 04/09/21 2053)    ED Course  I have reviewed the triage vital signs and the nursing notes.  Pertinent labs & imaging results that were available during my care of the patient were reviewed by me and considered in my medical decision making (see chart for details).  Clinical Course as of 04/09/21 2318  Sat Apr 09, 2021  2305 Resp Panel by RT-PCR (Flu A&B, Covid) Nasopharyngeal Swab [LS]    Clinical Course User Index [LS] Fransico Meadow, PA-C   MDM Rules/Calculators/A&P                           MDM:  Johnnye Lana is negative  Ct renal  no stone,  chest xray  normal   Final Clinical Impression(s) / ED Diagnoses Final diagnoses:  Fever, unspecified fever cause  Left low back pain, unspecified chronicity, unspecified whether sciatica present    Rx / DC Orders ED Discharge Orders     None     An After Visit Summary was printed and given to the patient.    Sidney Ace 04/09/21 2318    Milton Ferguson, MD 04/11/21 [id number]

## 2021-04-10 LAB — RESP PANEL BY RT-PCR (FLU A&B, COVID) ARPGX2
Influenza A by PCR: NEGATIVE
Influenza B by PCR: NEGATIVE
SARS Coronavirus 2 by RT PCR: NEGATIVE

## 2021-04-11 LAB — URINE CULTURE: Culture: <10000 — AB

## 2021-06-27 ENCOUNTER — Other Ambulatory Visit (HOSPITAL_COMMUNITY): Payer: Self-pay | Admitting: Family Medicine

## 2021-06-27 DIAGNOSIS — Z1231 Encounter for screening mammogram for malignant neoplasm of breast: Secondary | ICD-10-CM

## 2021-07-06 ENCOUNTER — Ambulatory Visit (HOSPITAL_COMMUNITY)
Admission: RE | Admit: 2021-07-06 | Discharge: 2021-07-06 | Disposition: A | Discharge status: Home / Self Care | Payer: Medicare Other | Source: Ambulatory Visit | Attending: Family Medicine | Admitting: Family Medicine

## 2021-07-06 ENCOUNTER — Other Ambulatory Visit: Payer: Self-pay

## 2021-07-06 DIAGNOSIS — Z1231 Encounter for screening mammogram for malignant neoplasm of breast: Secondary | ICD-10-CM | POA: Diagnosis not present

## 2021-09-27 ENCOUNTER — Other Ambulatory Visit: Payer: Self-pay

## 2021-09-27 ENCOUNTER — Ambulatory Visit
Admission: RE | Admit: 2021-09-27 | Discharge: 2021-09-27 | Disposition: A | Discharge status: Home / Self Care | Payer: Medicare Other | Source: Ambulatory Visit | Attending: Registered Nurse | Admitting: Registered Nurse

## 2021-09-27 ENCOUNTER — Other Ambulatory Visit: Payer: Self-pay | Admitting: Registered Nurse

## 2021-09-27 ENCOUNTER — Other Ambulatory Visit: Payer: Self-pay | Admitting: Family Medicine

## 2021-09-27 DIAGNOSIS — R2232 Localized swelling, mass and lump, left upper limb: Secondary | ICD-10-CM

## 2021-12-30 ENCOUNTER — Ambulatory Visit: Payer: Medicare Other | Admitting: Podiatry

## 2022-01-06 ENCOUNTER — Ambulatory Visit (INDEPENDENT_AMBULATORY_CARE_PROVIDER_SITE_OTHER): Payer: Medicare Other | Admitting: Podiatry

## 2022-01-06 DIAGNOSIS — M722 Plantar fascial fibromatosis: Secondary | ICD-10-CM

## 2022-01-09 ENCOUNTER — Ambulatory Visit: Payer: Self-pay | Admitting: Surgery

## 2022-01-09 DIAGNOSIS — K644 Residual hemorrhoidal skin tags: Secondary | ICD-10-CM | POA: Insufficient documentation

## 2022-01-09 DIAGNOSIS — K582 Mixed irritable bowel syndrome: Secondary | ICD-10-CM | POA: Insufficient documentation

## 2022-01-09 DIAGNOSIS — K642 Third degree hemorrhoids: Secondary | ICD-10-CM | POA: Insufficient documentation

## 2022-01-11 ENCOUNTER — Encounter: Payer: Self-pay | Admitting: Podiatry

## 2022-01-11 NOTE — Progress Notes (Signed)
Subjective:  Patient ID: Melanie [name], female    DOB: Jan 16, 1956,  MRN: [id number]  Chief Complaint  Patient presents with   Callouses    66 y.o. female presents with the above complaint.  Patient presents with right heel pain.  Patient states painful to touch is progressive gotten worse.  She would like to get it evaluated pain scale 7 out of 10 hurts with standing and getting up.  She has not seen anyone else prior to seeing me.  She denies any other acute complaints she is experiencing post attic dyskinesia type symptoms.   Review of Systems: Negative except as noted in the HPI. Denies N/V/F/Ch.  Past Medical History:  Diagnosis Date   Arthritis    Barrett's esophagus    RESOLVED AS PER LAST ENDOSCOPY    Depression    Fibromyalgia    Gallstones    RESOLVED WITH REMOVAL OF GALLBLADDER    Hypertension    DX WHEN SHE WEIGHED OVER 300LB , LOST WEIGHT WITH AID OF BARIATRIC SURGERY , NOW  NO LONGER TAKES BP MEDICATION    Obesity    Pernicious anemia    RESOLVED    PONV (postoperative nausea and vomiting)     Current Outpatient Medications:    Cholecalciferol (VITAMIN D3) 125 MCG (5000 UT) CAPS, Take 15,000 Units by mouth daily., Disp: , Rfl:    dicyclomine (BENTYL) 20 MG tablet, Take 1 tablet (20 mg total) by mouth in the morning, at noon, in the evening, and at bedtime., Disp: 120 tablet, Rfl: 2   ferrous sulfate 325 (65 FE) MG EC tablet, Take 325 mg by mouth daily., Disp: , Rfl:    irbesartan (AVAPRO) 150 MG tablet, Take 150 mg by mouth every morning., Disp: , Rfl:    PARoxetine (PAXIL) 20 MG tablet, Take 20 mg by mouth every morning., Disp: , Rfl:    promethazine (PHENERGAN) 25 MG tablet, Take 12.5 mg by mouth daily as needed for nausea or vomiting. , Disp: , Rfl:    traZODone (DESYREL) 150 MG tablet, Take 1 tablet (150 mg total) by mouth at bedtime., Disp: 30 tablet, Rfl: 5  Social History   Tobacco Use  Smoking Status Former   Types: Cigarettes   Quit date: 03/11/1993    Years since quitting: 28.8  Smokeless Tobacco Never    Allergies  Allergen Reactions   Ace Inhibitors Cough   Nsaids Nausea And Vomiting    Stomach pain   Penicillins Swelling        Sulfa Antibiotics Nausea Only   Objective:  There were no vitals filed for this visit. There is no height or weight on file to calculate BMI. Constitutional Well developed. Well nourished.  Vascular Dorsalis pedis pulses palpable bilaterally. Posterior tibial pulses palpable bilaterally. Capillary refill normal to all digits.  No cyanosis or clubbing noted. Pedal hair growth normal.  Neurologic Normal speech. Oriented to person, place, and time. Epicritic sensation to light touch grossly present bilaterally.  Dermatologic Nails well groomed and normal in appearance. No open wounds. No skin lesions.  Orthopedic: Normal joint ROM without pain or crepitus bilaterally. No visible deformities. Tender to palpation at the calcaneal tuber right. No pain with calcaneal squeeze right. Ankle ROM diminished range of motion right. Silfverskiold Test: positive right.   Radiographs: None  Assessment:   1. Plantar fasciitis of right foot    Plan:  Patient was evaluated and treated and all questions answered.  Plantar Fasciitis, right - XR reviewed as  above.  - Educated on icing and stretching. Instructions given.  - Injection delivered to the plantar fascia as below. - DME: Plantar fascial brace dispensed to support the medial longitudinal arch of the foot and offload pressure from the heel and prevent arch collapse during weightbearing - Pharmacologic management: None  Procedure: Injection Tendon/Ligament Location: Right plantar fascia at the glabrous junction; medial approach. Skin Prep: alcohol Injectate: 0.5 cc 0.5% marcaine plain, 0.5 cc of 1% Lidocaine, 0.5 cc kenalog 10. Disposition: Patient tolerated procedure well. Injection site dressed with a band-aid.  No follow-ups on file.

## 2022-02-03 ENCOUNTER — Ambulatory Visit: Payer: Medicare Other | Admitting: Podiatry

## 2022-03-22 ENCOUNTER — Other Ambulatory Visit: Payer: Self-pay | Admitting: Surgery

## 2022-04-16 HISTORY — PX: HEMORRHOID SURGERY: SHX153

## 2022-05-11 IMAGING — MG MM DIGITAL SCREENING BILAT W/ TOMO AND CAD
8 series · 8 of 24 positions shown · non-contrast
Comparison: Previous exam(s).

CLINICAL DATA: Screening.

EXAM:
DIGITAL SCREENING BILATERAL MAMMOGRAM WITH TOMOSYNTHESIS AND CAD
TECHNIQUE: Bilateral screening digital craniocaudal and mediolateral oblique
mammograms were obtained. Bilateral screening digital breast
tomosynthesis was performed. The images were evaluated with
computer-aided detection.

[L CC synth-2D]
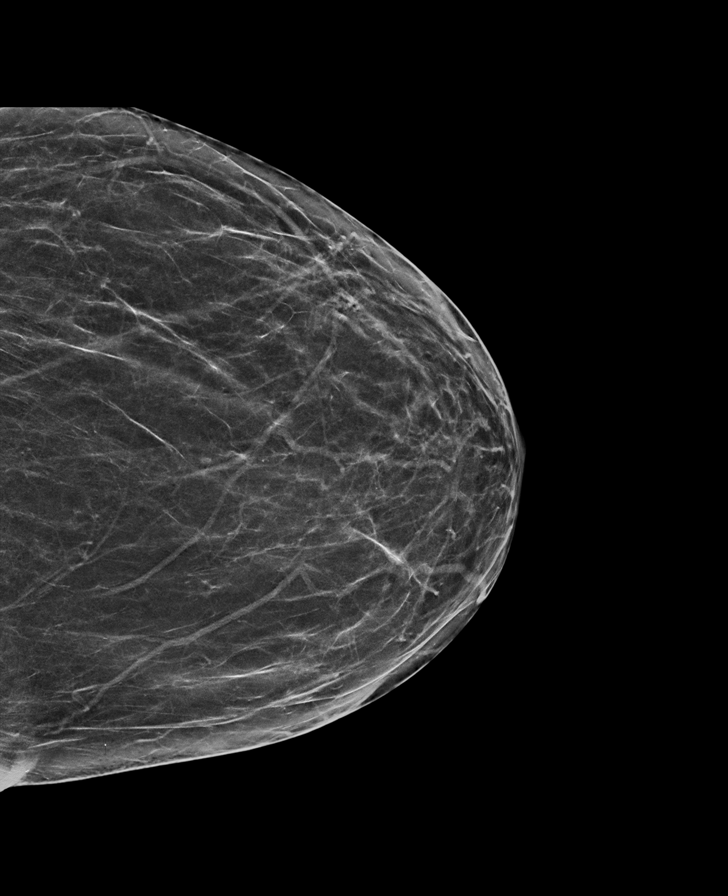

[R CC synth-2D]
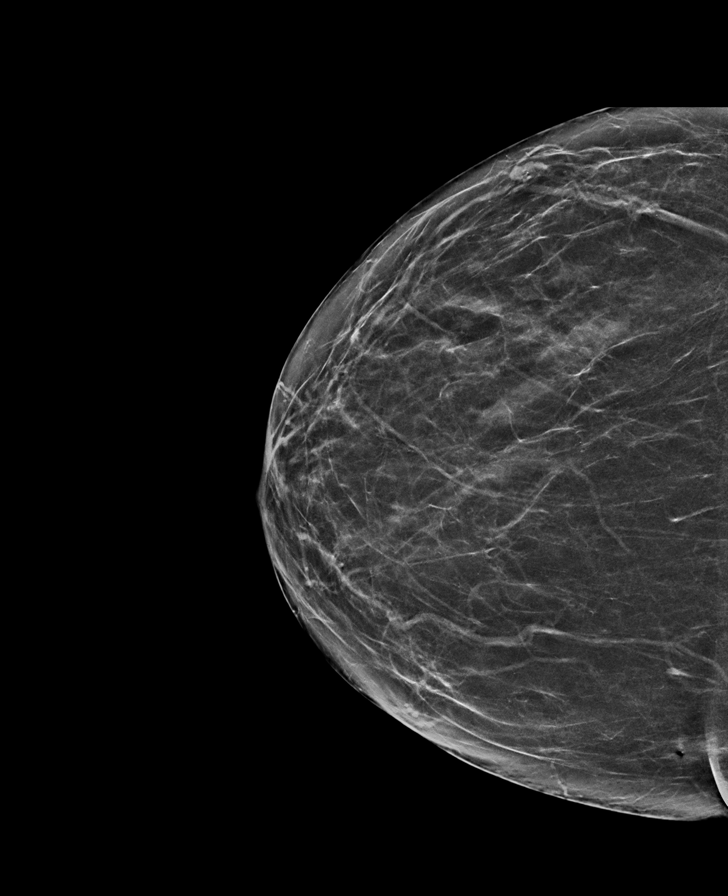

[L MLO synth-2D]
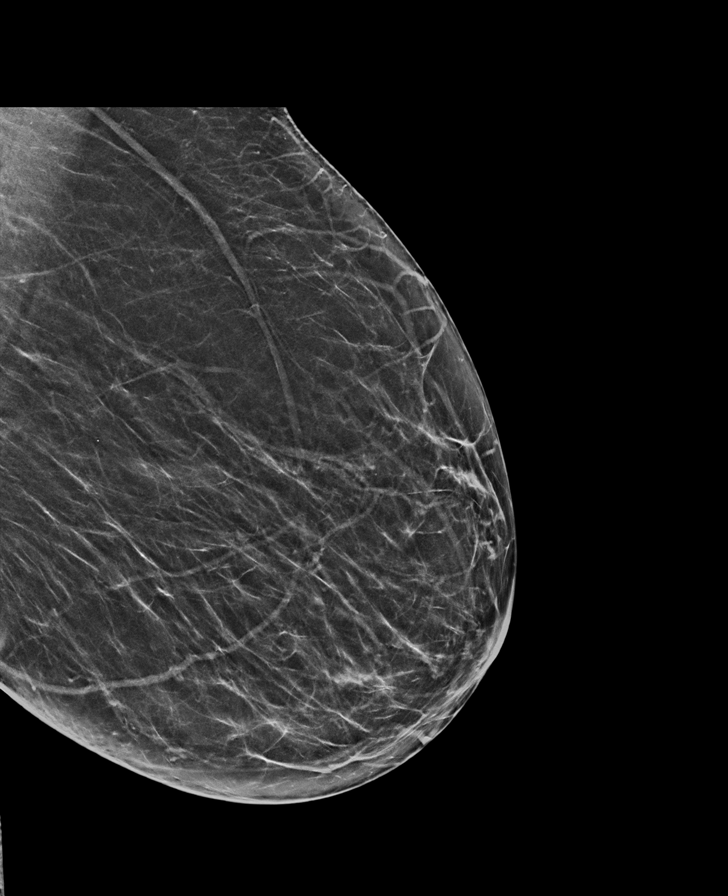

[R MLO synth-2D]
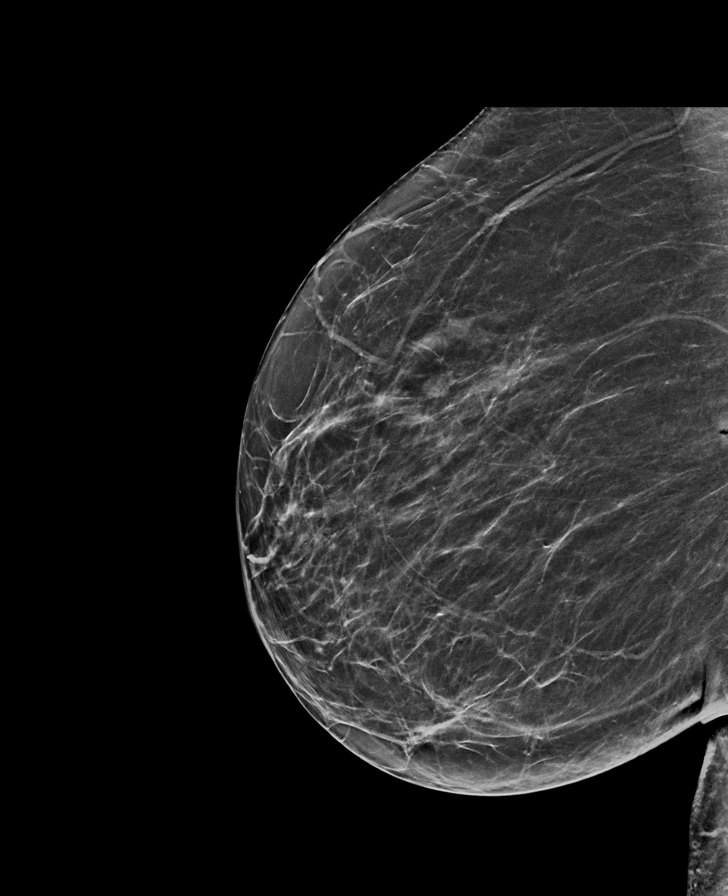

[L CC tomo · tomo slice 33/66.0]
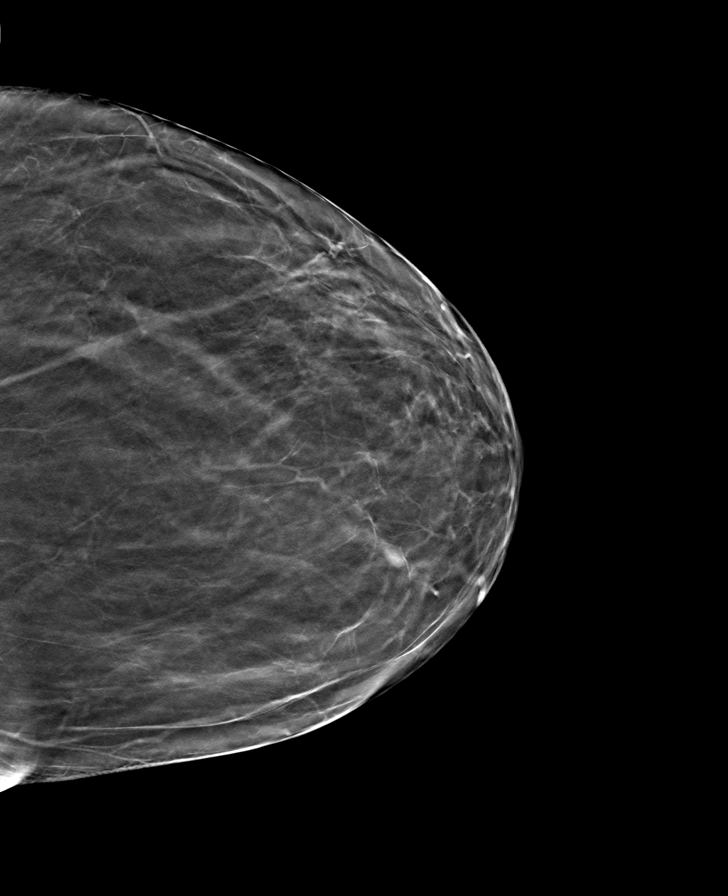

[R CC tomo · tomo slice 32/63.0]
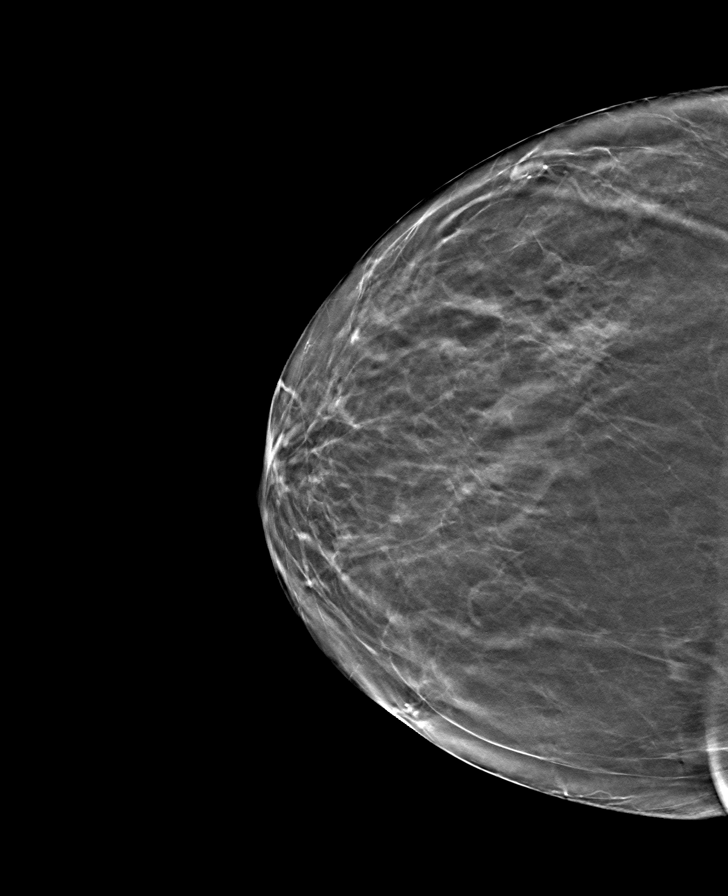

[L MLO tomo · tomo slice 35/68.0]
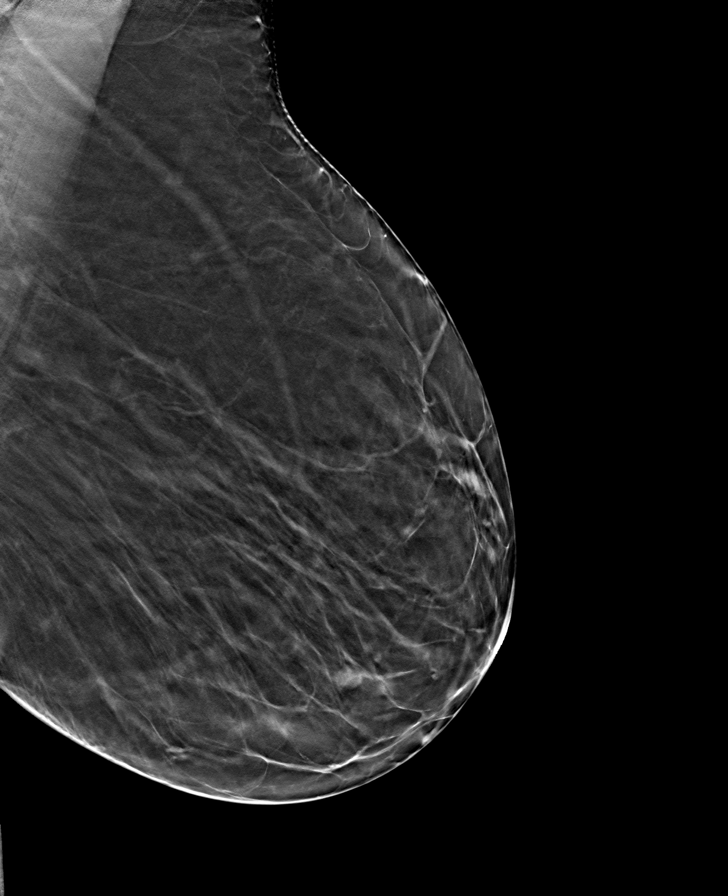

[R MLO tomo · tomo slice 33/65.0]
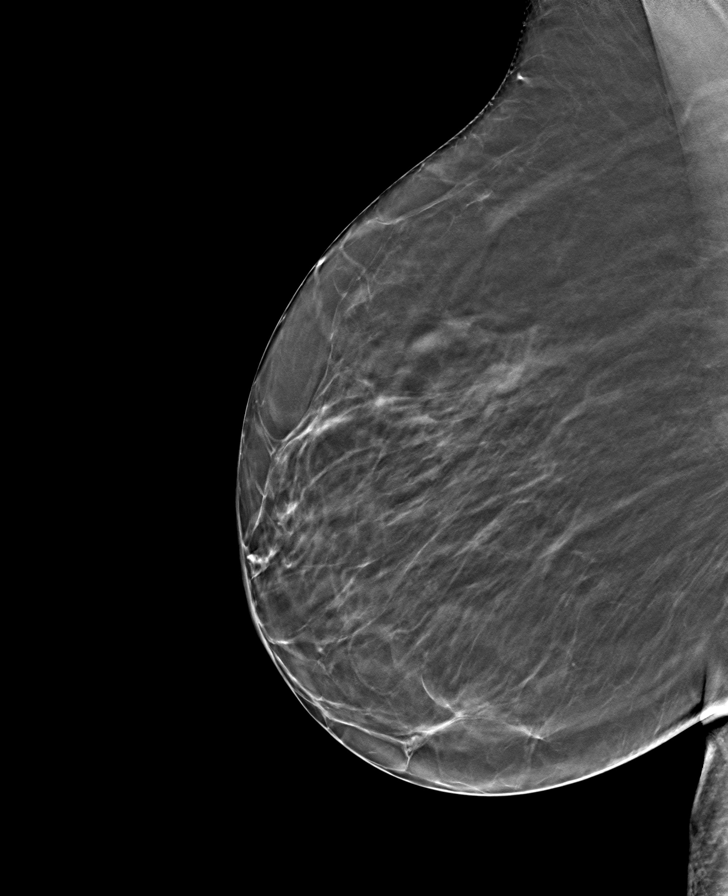

[8 of 24 positions shown; findings below may reference images not displayed]

ACR Breast Density Category b: There are scattered areas of
fibroglandular density.
FINDINGS: There are no findings suspicious for malignancy.
IMPRESSION: No mammographic evidence of malignancy. A result letter of this
screening mammogram will be mailed directly to the patient.

RECOMMENDATION:
Screening mammogram in one year. (Code:51-O-LD2)

BI-RADS CATEGORY  1: Negative.

## 2022-07-20 ENCOUNTER — Other Ambulatory Visit: Payer: Self-pay | Admitting: Registered Nurse

## 2022-07-20 ENCOUNTER — Encounter (HOSPITAL_COMMUNITY): Payer: Self-pay | Admitting: Registered Nurse

## 2022-07-20 DIAGNOSIS — D500 Iron deficiency anemia secondary to blood loss (chronic): Secondary | ICD-10-CM | POA: Diagnosis not present

## 2022-07-20 DIAGNOSIS — K219 Gastro-esophageal reflux disease without esophagitis: Secondary | ICD-10-CM | POA: Diagnosis not present

## 2022-07-20 DIAGNOSIS — F3341 Major depressive disorder, recurrent, in partial remission: Secondary | ICD-10-CM | POA: Diagnosis not present

## 2022-07-20 DIAGNOSIS — M542 Cervicalgia: Secondary | ICD-10-CM | POA: Diagnosis not present

## 2022-07-20 DIAGNOSIS — I10 Essential (primary) hypertension: Secondary | ICD-10-CM | POA: Diagnosis not present

## 2022-07-20 DIAGNOSIS — F5101 Primary insomnia: Secondary | ICD-10-CM | POA: Diagnosis not present

## 2022-07-20 DIAGNOSIS — E559 Vitamin D deficiency, unspecified: Secondary | ICD-10-CM | POA: Diagnosis not present

## 2022-07-20 DIAGNOSIS — E041 Nontoxic single thyroid nodule: Secondary | ICD-10-CM | POA: Diagnosis not present

## 2022-07-20 DIAGNOSIS — E538 Deficiency of other specified B group vitamins: Secondary | ICD-10-CM | POA: Diagnosis not present

## 2022-07-20 DIAGNOSIS — Z8249 Family history of ischemic heart disease and other diseases of the circulatory system: Secondary | ICD-10-CM | POA: Diagnosis not present

## 2022-07-20 DIAGNOSIS — R490 Dysphonia: Secondary | ICD-10-CM | POA: Diagnosis not present

## 2022-07-20 DIAGNOSIS — Z9889 Other specified postprocedural states: Secondary | ICD-10-CM | POA: Diagnosis not present

## 2022-08-08 ENCOUNTER — Ambulatory Visit
Admission: RE | Admit: 2022-08-08 | Discharge: 2022-08-08 | Disposition: A | Discharge status: Home / Self Care | Payer: PPO | Source: Ambulatory Visit | Attending: Registered Nurse | Admitting: Registered Nurse

## 2022-08-08 DIAGNOSIS — E041 Nontoxic single thyroid nodule: Secondary | ICD-10-CM | POA: Diagnosis not present

## 2022-08-08 DIAGNOSIS — E559 Vitamin D deficiency, unspecified: Secondary | ICD-10-CM

## 2022-09-06 DIAGNOSIS — Z9889 Other specified postprocedural states: Secondary | ICD-10-CM | POA: Diagnosis not present

## 2022-09-06 DIAGNOSIS — R1310 Dysphagia, unspecified: Secondary | ICD-10-CM | POA: Diagnosis not present

## 2022-09-06 DIAGNOSIS — E559 Vitamin D deficiency, unspecified: Secondary | ICD-10-CM | POA: Diagnosis not present

## 2022-09-06 DIAGNOSIS — K219 Gastro-esophageal reflux disease without esophagitis: Secondary | ICD-10-CM | POA: Diagnosis not present

## 2022-09-06 DIAGNOSIS — E538 Deficiency of other specified B group vitamins: Secondary | ICD-10-CM | POA: Diagnosis not present

## 2022-09-06 DIAGNOSIS — M797 Fibromyalgia: Secondary | ICD-10-CM | POA: Diagnosis not present

## 2022-09-06 DIAGNOSIS — F418 Other specified anxiety disorders: Secondary | ICD-10-CM | POA: Diagnosis not present

## 2022-09-06 DIAGNOSIS — D500 Iron deficiency anemia secondary to blood loss (chronic): Secondary | ICD-10-CM | POA: Diagnosis not present

## 2022-09-06 DIAGNOSIS — R7989 Other specified abnormal findings of blood chemistry: Secondary | ICD-10-CM | POA: Diagnosis not present

## 2022-09-11 DIAGNOSIS — D500 Iron deficiency anemia secondary to blood loss (chronic): Secondary | ICD-10-CM | POA: Diagnosis not present

## 2022-09-18 DIAGNOSIS — D500 Iron deficiency anemia secondary to blood loss (chronic): Secondary | ICD-10-CM | POA: Diagnosis not present

## 2022-09-22 DIAGNOSIS — K5901 Slow transit constipation: Secondary | ICD-10-CM | POA: Diagnosis not present

## 2022-09-22 DIAGNOSIS — I10 Essential (primary) hypertension: Secondary | ICD-10-CM | POA: Diagnosis not present

## 2022-09-22 DIAGNOSIS — R1084 Generalized abdominal pain: Secondary | ICD-10-CM | POA: Diagnosis not present

## 2022-09-23 ENCOUNTER — Encounter (HOSPITAL_COMMUNITY): Payer: Self-pay

## 2022-09-23 ENCOUNTER — Other Ambulatory Visit: Payer: Self-pay

## 2022-09-23 ENCOUNTER — Emergency Department (HOSPITAL_COMMUNITY)
Admission: EM | Admit: 2022-09-23 | Discharge: 2022-09-23 | Disposition: A | Discharge status: Home / Self Care | Payer: PPO | Attending: Emergency Medicine | Admitting: Emergency Medicine

## 2022-09-23 DIAGNOSIS — K5900 Constipation, unspecified: Secondary | ICD-10-CM | POA: Diagnosis not present

## 2022-09-23 MED ORDER — SENNA 8.6 MG PO TABS
1.0000 | ORAL_TABLET | Freq: Once | ORAL | Status: AC
  Administered 2022-09-23: 8.6 mg via ORAL
  Filled 2022-09-23: qty 1

## 2022-09-23 MED ORDER — DOCUSATE SODIUM 100 MG PO CAPS: 100.0000 mg | ORAL_CAPSULE | Freq: Two times a day (BID) | ORAL | 0 refills | Status: DC

## 2022-09-23 MED ORDER — POLYETHYLENE GLYCOL 3350 17 G PO PACK
17.0000 g | PACK | Freq: Once | ORAL | Status: AC
  Administered 2022-09-23: 17 g via ORAL
  Filled 2022-09-23: qty 1

## 2022-09-23 MED ORDER — POLYETHYLENE GLYCOL 3350 17 G PO PACK: 17.0000 g | PACK | Freq: Every day | ORAL | 0 refills | Status: DC

## 2022-09-23 MED ORDER — SENNOSIDES-DOCUSATE SODIUM 8.6-50 MG PO TABS: 1.0000 | ORAL_TABLET | Freq: Every day | ORAL | 0 refills | Status: AC

## 2022-09-23 MED ORDER — DOCUSATE SODIUM 100 MG PO CAPS
100.0000 mg | ORAL_CAPSULE | Freq: Once | ORAL | Status: AC
  Administered 2022-09-23: 100 mg via ORAL
  Filled 2022-09-23: qty 1

## 2022-09-23 NOTE — ED Triage Notes (Signed)
Pt arrived from home via [facility] c/o constipation. Pt went to PCP on Friday 3/9. PCP did xray with resulting in constipation dx. PCP ordered linzess and enema. Pt started linzess yesterday x 2 doses as of now and enema at 1800 today with zero results. Pt now feels bloated and is nauseous.

## 2022-09-23 NOTE — Discharge Instructions (Addendum)
Evaluation for your constipation was overall reassuring.  I have started you on Colace senna and MiraLAX which can be added to your current bowel regiment.  Advised that you take the medication for least 7 days and follow-up with your PCP in the next 2 to 3 days for reassessment.  If you start to have improvement in your bowel movements she can stop the medications.  If you have worsening abdominal pain, blood in your stool, begin to vomit, inability to tolerate fluid or food intake or any other concerning symptom please return emergency department for further evaluation.

## 2022-09-23 NOTE — ED Provider Notes (Signed)
[place] Provider Note   CSN: [id number] Arrival date & time: 09/23/22  1910     History  Chief Complaint  Patient presents with   Constipation   HPI Melanie [name] is a 67 y.o. female with IBS and Barretts Esophagus presenting for constipation.  Symptoms have been going on for about 2 weeks.  Patient was initially trying prune juice at home with no result.  Patient did states she did have a bowel movement on Wednesday but was small.  Upon evaluation by her PCP yesterday who prescribed Linzess and an enema.  States she had a small bowel movement with diarrhea yesterday but now feeling bloated and nauseous.  Denies vomiting.  Endorses some upper abdominal discomfort but no pain.   Constipation      Home Medications Prior to Admission medications   Medication Sig Start Date End Date Taking? Authorizing Provider  docusate sodium (COLACE) 100 MG capsule Take 1 capsule (100 mg total) by mouth every 12 (twelve) hours. 09/23/22  Yes Harriet Pho, PA-C  polyethylene glycol (MIRALAX / GLYCOLAX) 17 g packet Take 17 g by mouth daily. 09/23/22  Yes Joanette Gula K, PA-C  senna-docusate (SENOKOT-S) 8.6-50 MG tablet Take 1 tablet by mouth daily for 7 days. 09/23/22 09/30/22 Yes Harriet Pho, PA-C  Cholecalciferol (VITAMIN D3) 125 MCG (5000 UT) CAPS Take 15,000 Units by mouth daily.    [provider]  dicyclomine (BENTYL) 20 MG tablet Take 1 tablet (20 mg total) by mouth in the morning, at noon, in the evening, and at bedtime. 11/23/20   Levin Erp, PA  ferrous sulfate 325 (65 FE) MG EC tablet Take 325 mg by mouth daily.    [provider]  irbesartan (AVAPRO) 150 MG tablet Take 150 mg by mouth every morning. 12/16/19   [provider]  PARoxetine (PAXIL) 20 MG tablet Take 20 mg by mouth every morning. 12/16/19   [provider]  promethazine (PHENERGAN) 25 MG tablet Take 12.5 mg by mouth daily as needed  for nausea or vomiting.     [provider]  traZODone (DESYREL) 150 MG tablet Take 1 tablet (150 mg total) by mouth at bedtime. 08/04/15   Burns, Arloa Koh, MD      Allergies    Ace inhibitors, Nsaids, Penicillins, and Sulfa antibiotics    Review of Systems   Review of Systems  Gastrointestinal:  Positive for constipation.    Physical Exam   Vitals:   09/23/22 1955  BP: (!) 144/95  Pulse: 90  Resp: 16  Temp: 98.2 F (36.8 C)  SpO2: 100%    CONSTITUTIONAL:  well-appearing, NAD NEURO:  Alert and oriented x 3, CN 3-12 grossly intact EYES:  eyes equal and reactive ENT/NECK:  Supple, no stridor  CARDIO:  appears well-perfused  PULM:  No respiratory distress, CTAB GI/GU:  non-distended, soft, non tender, bowel sounds normal MSK/SPINE:  No gross deformities, no edema, moves all extremities SKIN:  no rash, atraumatic   *Additional and/or pertinent findings included in MDM below    ED Results / Procedures / Treatments   Labs (all labs ordered are listed, but only abnormal results are displayed) Labs Reviewed - No data to display  EKG None  Radiology No results found.  Procedures Procedures    Medications Ordered in ED Medications  docusate sodium (COLACE) capsule 100 mg (100 mg Oral Given 09/23/22 2246)  senna (SENOKOT) tablet 8.6 mg (8.6 mg Oral  Given 09/23/22 2246)  polyethylene glycol (MIRALAX / GLYCOLAX) packet 17 g (17 g Oral Given 09/23/22 2246)    ED Course/ Medical Decision Making/ A&P                             Medical Decision Making  67 year old female who is well-appearing presenting for constipation.  Exam was unremarkable.  Given that patient did have a bowel movement yesterday and has relatively normal bowel sounds on auscultation today, felt that at this time conservative treatment for constipation is worsened.  Did consider possible intra-abdominal infection, diverticulitis or colitis but unlikely given unremarkable abdominal exam.   Discussed with patient that we could workup further with CT scan but patient stated that conservative treatment would be preferable at this time and that she would follow-up with her PCP.  Started patient on Colace, senna and MiraLAX.  Patient states she is never taken these meds before.  Advised her to take the regiment for 7 days.  Also advised her to follow-up with her PCP in the next 2 to 3 days.  Discussed pertinent return precautions.        Final Clinical Impression(s) / ED Diagnoses Final diagnoses:  Constipation, unspecified constipation type    Rx / DC Orders ED Discharge Orders          Ordered    senna-docusate (SENOKOT-S) 8.6-50 MG tablet  Daily        09/23/22 2238    docusate sodium (COLACE) 100 MG capsule  Every 12 hours        09/23/22 2238    polyethylene glycol (MIRALAX / GLYCOLAX) 17 g packet  Daily        09/23/22 2238              Harriet Pho, PA-C 09/23/22 2256    Dorie Rank, MD 09/24/22 1550

## 2022-10-04 DIAGNOSIS — K5909 Other constipation: Secondary | ICD-10-CM | POA: Diagnosis not present

## 2022-10-04 DIAGNOSIS — R109 Unspecified abdominal pain: Secondary | ICD-10-CM | POA: Diagnosis not present

## 2022-10-04 DIAGNOSIS — Z800 Family history of malignant neoplasm of digestive organs: Secondary | ICD-10-CM | POA: Diagnosis not present

## 2022-10-04 DIAGNOSIS — Z86010 Personal history of colonic polyps: Secondary | ICD-10-CM | POA: Diagnosis not present

## 2022-10-05 ENCOUNTER — Other Ambulatory Visit: Payer: Self-pay | Admitting: Registered Nurse

## 2022-10-05 ENCOUNTER — Other Ambulatory Visit (HOSPITAL_COMMUNITY): Payer: Self-pay | Admitting: Registered Nurse

## 2022-10-05 DIAGNOSIS — K5909 Other constipation: Secondary | ICD-10-CM

## 2022-10-05 DIAGNOSIS — Z800 Family history of malignant neoplasm of digestive organs: Secondary | ICD-10-CM

## 2022-10-05 DIAGNOSIS — R109 Unspecified abdominal pain: Secondary | ICD-10-CM

## 2022-10-11 ENCOUNTER — Ambulatory Visit (HOSPITAL_COMMUNITY)
Admission: RE | Admit: 2022-10-11 | Discharge: 2022-10-11 | Disposition: A | Discharge status: Home / Self Care | Payer: PPO | Source: Ambulatory Visit | Attending: Registered Nurse | Admitting: Registered Nurse

## 2022-10-11 DIAGNOSIS — K5909 Other constipation: Secondary | ICD-10-CM | POA: Diagnosis not present

## 2022-10-11 DIAGNOSIS — Z800 Family history of malignant neoplasm of digestive organs: Secondary | ICD-10-CM | POA: Diagnosis not present

## 2022-10-11 DIAGNOSIS — R109 Unspecified abdominal pain: Secondary | ICD-10-CM | POA: Diagnosis not present

## 2022-10-11 DIAGNOSIS — R1110 Vomiting, unspecified: Secondary | ICD-10-CM | POA: Diagnosis not present

## 2022-10-11 LAB — POCT I-STAT CREATININE: Creatinine, Ser: 0.7 mg/dL (ref 0.44–1.00)

## 2022-10-11 MED ORDER — IOHEXOL 300 MG/ML  SOLN
100.0000 mL | Freq: Once | INTRAMUSCULAR | Status: AC | PRN
  Administered 2022-10-11: 100 mL via INTRAVENOUS

## 2022-10-19 ENCOUNTER — Ambulatory Visit: Payer: PPO | Admitting: Physician Assistant

## 2022-10-30 ENCOUNTER — Other Ambulatory Visit (HOSPITAL_COMMUNITY): Payer: Self-pay | Admitting: Registered Nurse

## 2022-10-30 DIAGNOSIS — M542 Cervicalgia: Secondary | ICD-10-CM

## 2022-11-02 ENCOUNTER — Ambulatory Visit: Payer: PPO | Admitting: Nurse Practitioner

## 2022-11-10 DIAGNOSIS — Z961 Presence of intraocular lens: Secondary | ICD-10-CM | POA: Diagnosis not present

## 2022-11-10 DIAGNOSIS — H2511 Age-related nuclear cataract, right eye: Secondary | ICD-10-CM | POA: Diagnosis not present

## 2022-11-10 DIAGNOSIS — H18413 Arcus senilis, bilateral: Secondary | ICD-10-CM | POA: Diagnosis not present

## 2022-11-10 DIAGNOSIS — H25011 Cortical age-related cataract, right eye: Secondary | ICD-10-CM | POA: Diagnosis not present

## 2022-11-13 ENCOUNTER — Ambulatory Visit (HOSPITAL_COMMUNITY): Admission: RE | Admit: 2022-11-13 | Payer: PPO | Source: Ambulatory Visit

## 2022-11-13 ENCOUNTER — Encounter (HOSPITAL_COMMUNITY): Payer: Self-pay

## 2022-11-20 DIAGNOSIS — H2511 Age-related nuclear cataract, right eye: Secondary | ICD-10-CM | POA: Diagnosis not present

## 2022-12-29 ENCOUNTER — Ambulatory Visit: Payer: PPO | Admitting: Physician Assistant

## 2023-01-04 ENCOUNTER — Encounter: Payer: Self-pay | Admitting: Gastroenterology

## 2023-01-04 ENCOUNTER — Other Ambulatory Visit (INDEPENDENT_AMBULATORY_CARE_PROVIDER_SITE_OTHER): Payer: PPO

## 2023-01-04 ENCOUNTER — Ambulatory Visit: Payer: PPO | Admitting: Gastroenterology

## 2023-01-04 VITALS — BP 154/94 | HR 83 | Ht 64.0 in | Wt 223.0 lb

## 2023-01-04 DIAGNOSIS — R1011 Right upper quadrant pain: Secondary | ICD-10-CM | POA: Diagnosis not present

## 2023-01-04 DIAGNOSIS — R1310 Dysphagia, unspecified: Secondary | ICD-10-CM

## 2023-01-04 DIAGNOSIS — D509 Iron deficiency anemia, unspecified: Secondary | ICD-10-CM

## 2023-01-04 LAB — CBC WITH DIFFERENTIAL/PLATELET
Basophils Absolute: 0.1 10*3/uL (ref 0.0–0.1)
Basophils Relative: 0.9 % (ref 0.0–3.0)
Eosinophils Absolute: 0.2 10*3/uL (ref 0.0–0.7)
Eosinophils Relative: 2.6 % (ref 0.0–5.0)
HCT: 45.2 % (ref 36.0–46.0)
Hemoglobin: 14.7 g/dL (ref 12.0–15.0)
Lymphocytes Relative: 29.6 % (ref 12.0–46.0)
Lymphs Abs: 1.7 10*3/uL (ref 0.7–4.0)
MCHC: 32.5 g/dL (ref 30.0–36.0)
MCV: 91.6 fl (ref 78.0–100.0)
Monocytes Absolute: 0.6 10*3/uL (ref 0.1–1.0)
Monocytes Relative: 10 % (ref 3.0–12.0)
Neutro Abs: 3.3 10*3/uL (ref 1.4–7.7)
Neutrophils Relative %: 56.9 % (ref 43.0–77.0)
Platelets: 235 10*3/uL (ref 150.0–400.0)
RBC: 4.94 Mil/uL (ref 3.87–5.11)
RDW: 13.2 % (ref 11.5–15.5)
WBC: 5.8 10*3/uL (ref 4.0–10.5)

## 2023-01-04 LAB — IBC + FERRITIN
Ferritin: 101.4 ng/mL (ref 10.0–291.0)
Iron: 139 ug/dL (ref 42–145)
Saturation Ratios: 52.3 % — ABNORMAL HIGH (ref 20.0–50.0)
TIBC: 266 ug/dL (ref 250.0–450.0)
Transferrin: 190 mg/dL — ABNORMAL LOW (ref 212.0–360.0)

## 2023-01-04 LAB — HEPATIC FUNCTION PANEL
ALT: 21 U/L (ref 0–35)
AST: 18 U/L (ref 0–37)
Albumin: 3.8 g/dL (ref 3.5–5.2)
Alkaline Phosphatase: 128 U/L — ABNORMAL HIGH (ref 39–117)
Bilirubin, Direct: 0.1 mg/dL (ref 0.0–0.3)
Total Bilirubin: 0.7 mg/dL (ref 0.2–1.2)
Total Protein: 6.7 g/dL (ref 6.0–8.3)

## 2023-01-04 NOTE — Progress Notes (Signed)
Noted  

## 2023-01-04 NOTE — Progress Notes (Signed)
01/04/2023 Melanie [name] [id number] 08/23/1955   HISTORY OF PRESENT ILLNESS: This is a 67 year old female who is a patient Dr. Lamar Sprinkles.  She presents here today with complaints of dysphagia.  First she describes foods such as beef and chicken gathering in her chest at times.  And she also describes coughing and choking while eating/swallowing.  She does have acid reflux and takes omeprazole 20 mg daily for that.  She also reports daily constant right upper quadrant abdominal pain for the past several months.  She had a CT scan of the abdomen and pelvis with contrast in March 2024 that showed the following:  IMPRESSION: Prior gastric bypass surgery.   Mild biliary dilatation, can be physiologic post cholecystectomy; recommend correlation with LFTs.   No additional intra-abdominal or intrapelvic abnormalities.    EGD May 2022:  Showed a normal esophagus with patent Billroth II anatomy that appeared healthy, some gastritis.  Colonoscopy May 2022:  -Preparation of the colon was fair. - Hemorrhoids found on perianal exam. - Three 3 to 5 mm polyps in the sigmoid colon, in the descending colon and in the cecum, removed with a cold snare. Resected and retrieved. - Stool in the entire examined colon. - Normal mucosa in the entire examined colon. Biopsied. - There was significant looping of the colon. - Non-bleeding internal hemorrhoids.  1. Surgical [P], duodenal - DUODENAL MUCOSA WITH NO SPECIFIC HISTOPATHOLOGIC CHANGES - NEGATIVE FOR INCREASED INTRAEPITHELIAL LYMPHOCYTES OR VILLOUS ARCHITECTURAL CHANGES 2. Surgical [P], gastric - GASTRIC ANTRAL AND OXYNTIC MUCOSA WITH NONSPECIFIC REACTIVE GASTROPATHY - WARTHIN STARRY STAIN IS NEGATIVE FOR HELICOBACTER PYLORI 3. Surgical [P], colon, cecum, descending, sigmoid, polyp (3) - TUBULAR ADENOMA(S) - NEGATIVE FOR HIGH-GRADE DYSPLASIA OR MALIGNANCY 4. Surgical [P], ascending (right colon) - COLONIC MUCOSA WITH NO SPECIFIC HISTOPATHOLOGIC  CHANGES - NEGATIVE FOR ACUTE INFLAMMATION, INCREASED INTRAEPITHELIAL LYMPHOCYTES OR THICKENED SUBEPITHELIAL COLLAGEN TABLE 5. Surgical [P], descending (left colon) - COLONIC MUCOSA WITH NO SPECIFIC HISTOPATHOLOGIC CHANGES - NEGATIVE FOR ACUTE INFLAMMATION, INCREASED INTRAEPITHELIAL LYMPHOCYTES OR THICKENED SUBEPITHELIAL COLLAGEN TABLE  She was having a lot of bleeding from the hemorrhoids so was referred for hemorrhoid surgery, which she had in October 2023.  She was on oral iron supplements and then received IV iron infusions.  No labs checked recently.   Past Medical History:  Diagnosis Date   Arthritis    Barrett's esophagus    RESOLVED AS PER LAST ENDOSCOPY    Depression    Fibromyalgia    Gallstones    RESOLVED WITH REMOVAL OF GALLBLADDER    Hypertension    DX WHEN SHE WEIGHED OVER 300LB , LOST WEIGHT WITH AID OF BARIATRIC SURGERY , NOW  NO LONGER TAKES BP MEDICATION    Obesity    Pernicious anemia    RESOLVED    PONV (postoperative nausea and vomiting)    Past Surgical History:  Procedure Laterality Date   APPENDECTOMY     BARIATRIC SURGERY     BREAST CYST EXCISION Right    x 2   CATARACT EXTRACTION     06-12-2022 Left eye 11-20-2022 Right eye  Surgical center   CESAREAN SECTION     x 2   CHOLECYSTECTOMY     EYE SURGERY     HEMORRHOID SURGERY  04/2022   [facility] Dr Michaell Cowing   ovaries removed     ROTATOR CUFF REPAIR Left    TONSILLECTOMY     TOTAL KNEE ARTHROPLASTY Left    TOTAL KNEE ARTHROPLASTY Right  04/07/2019   Procedure: TOTAL KNEE ARTHROPLASTY;  Surgeon: Ollen Gross, MD;  Location: [facility];  Service: Orthopedics;  Laterality: Right;    VAGINAL HYSTERECTOMY      reports that she quit smoking about 29 years ago. Her smoking use included cigarettes. She has never used smokeless tobacco. She reports current alcohol use. She reports that she does not use drugs. family history includes Breast cancer in her paternal aunt; Colon cancer in her mother,  paternal aunt, and another family member; Esophageal cancer in her father; Heart disease in her mother; Hypertension in her mother; Lung cancer in her paternal aunt; Skin cancer in her mother and another family member; Stroke in her mother. Allergies  Allergen Reactions   Ace Inhibitors Cough   Nsaids Nausea And Vomiting    Stomach pain   Penicillins Swelling        Sulfa Antibiotics Nausea Only      Outpatient Encounter Medications as of 01/04/2023  Medication Sig   AMBULATORY NON FORMULARY MEDICATION 2 capsules every morning. Medication Name: Super beets   Biotin 5 MG TABS Take 2 tablets by mouth every evening.   CALCIUM CITRATE PO Take 1,200 mg by mouth every evening.   cholecalciferol (VITAMIN D3) 25 MCG (1000 UNIT) tablet Take 2,000 Units by mouth daily.   COD LIVER OIL PO Take 400 mg by mouth daily.   dicyclomine (BENTYL) 20 MG tablet Take 1 tablet (20 mg total) by mouth in the morning, at noon, in the evening, and at bedtime. (Patient taking differently: Take 20 mg by mouth as needed.)   DULoxetine (CYMBALTA) 30 MG capsule Take 30 mg by mouth 2 (two) times daily.   irbesartan (AVAPRO) 150 MG tablet Take 150 mg by mouth 2 (two) times daily.   Lactobacillus (ACIDOPHILUS PO) Take 20 mg by mouth every morning.   omeprazole (PRILOSEC) 20 MG capsule Take 40 mg by mouth every morning.   traZODone (DESYREL) 150 MG tablet Take 1 tablet (150 mg total) by mouth at bedtime. (Patient taking differently: Take 75 mg by mouth at bedtime.)   docusate sodium (COLACE) 100 MG capsule Take 1 capsule (100 mg total) by mouth every 12 (twelve) hours. (Patient taking differently: Take 200 mg by mouth every morning.)   [DISCONTINUED] Cholecalciferol (VITAMIN D3) 125 MCG (5000 UT) CAPS Take 15,000 Units by mouth daily.   [DISCONTINUED] ferrous sulfate 325 (65 FE) MG EC tablet Take 325 mg by mouth daily.   [DISCONTINUED] PARoxetine (PAXIL) 20 MG tablet Take 20 mg by mouth every morning.   [DISCONTINUED]  polyethylene glycol (MIRALAX / GLYCOLAX) 17 g packet Take 17 g by mouth daily.   [DISCONTINUED] promethazine (PHENERGAN) 25 MG tablet Take 12.5 mg by mouth daily as needed for nausea or vomiting.    No facility-administered encounter medications on file as of 01/04/2023.    REVIEW OF SYSTEMS  : All other systems reviewed and negative except where noted in the History of Present Illness.   PHYSICAL EXAM: BP (!) 154/94   Pulse 83   Ht 5\' 4"  (1.626 m)   Wt 223 lb (101.2 kg)   BMI 38.28 kg/m  General: Well developed white female in no acute distress Head: Normocephalic and atraumatic Eyes:  Sclerae anicteric, conjunctiva pink. Ears: Normal auditory acuity Lungs: Clear throughout to auscultation; no W/R/R. Heart: Regular rate and rhythm; no M/R/G. Abdomen: Soft, non-distended.  BS present.  Mild right upper quadrant TTP.  Scar noted from previous cholecystectomy in the right upper quadrant.  Musculoskeletal: Symmetrical with no gross deformities  Skin: No lesions on visible extremities Extremities: No edema  Neurological: Alert oriented x 4, grossly non-focal Psychological:  Alert and cooperative. Normal mood and affect  ASSESSMENT AND PLAN: *Dysphagia: She really describes 2 different things happening.  She describes food such as chicken and beef getting hung in her chest intermittently so I wonder if that could be somewhat of an esophageal dysmotility.  We can evaluate that with an esophagram first.  Secondly she describes coughing while eating/swallowing.  Will likely need modified barium swallow study with speech to evaluate oropharyngeal swallowing. *Right upper quadrant abdominal pain: Question if this could be musculoskeletal versus nerve related versus scar tissue.  She did have her gallbladder removed for gallstone issues several years ago.  CT scan in March shows mild biliary dilatation which may be physiologic postcholecystectomy.  Will check LFTs today.  Consider MRI abdomen/MRCP  based on these results. *IDA: Had EGD and colonoscopy in 2022.  Was having a lot of bleeding from hemorrhoids and had surgery on those in October 2023.  Was on oral iron and then received IV iron infusions.  Will recheck CBC and iron studies today.  CC:  Irena Reichmann, DO

## 2023-01-04 NOTE — Patient Instructions (Addendum)
_______________________________________________________  If your blood pressure at your visit was 140/90 or greater, please contact your primary care physician to follow up on this.  _______________________________________________________  If you are age 68 or older, your body mass index should be between 23-30. Your Body mass index is 38.28 kg/m. If this is out of the aforementioned range listed, please consider follow up with your Primary Care Provider.  If you are age 21 or younger, your body mass index should be between 19-25. Your Body mass index is 38.28 kg/m. If this is out of the aformentioned range listed, please consider follow up with your Primary Care Provider.   ________________________________________________________  The [facility] providers would like to encourage you to use [facility] to communicate with providers for non-urgent requests or questions.  Due to long hold times on the telephone, sending your provider a message by [facility] may be a faster and more efficient way to get a response.  Please allow 48 business hours for a response.  Please remember that this is for non-urgent requests.  ____________________________________________________  Your provider has requested that you go to the basement level for lab work before leaving today. Press "B" on the elevator. The lab is located at the first door on the left as you exit the elevator.   You have been scheduled for a Barium Esophogram at [facility] (1st floor of the hospital) on Thursday 7/18 at 9 am. Please arrive 30 minutes prior to your appointment for registration. Make certain not to have anything to eat or drink 3 hours prior to your test. If you need to reschedule for any reason, please contact radiology at [phone] to do so. __________________________________________________________________ A barium swallow is an examination that concentrates on views of the esophagus. This tends to be a double  contrast exam (barium and two liquids which, when combined, create a gas to distend the wall of the oesophagus) or single contrast (non-ionic iodine based). The study is usually tailored to your symptoms so a good history is essential. Attention is paid during the study to the form, structure and configuration of the esophagus, looking for functional disorders (such as aspiration, dysphagia, achalasia, motility and reflux) EXAMINATION You may be asked to change into a gown, depending on the type of swallow being performed. A radiologist and radiographer will perform the procedure. The radiologist will advise you of the type of contrast selected for your procedure and direct you during the exam. You will be asked to stand, sit or lie in several different positions and to hold a small amount of fluid in your mouth before being asked to swallow while the imaging is performed .In some instances you may be asked to swallow barium coated marshmallows to assess the motility of a solid food bolus. The exam can be recorded as a digital or video fluoroscopy procedure. POST PROCEDURE It will take 1-2 days for the barium to pass through your system. To facilitate this, it is important, unless otherwise directed, to increase your fluids for the next 24-48hrs and to resume your normal diet.  This test typically takes about 30 minutes to perform. __________________________________________________________________________________

## 2023-01-05 ENCOUNTER — Other Ambulatory Visit: Payer: Self-pay

## 2023-01-05 DIAGNOSIS — R748 Abnormal levels of other serum enzymes: Secondary | ICD-10-CM

## 2023-01-05 DIAGNOSIS — R9389 Abnormal findings on diagnostic imaging of other specified body structures: Secondary | ICD-10-CM

## 2023-01-05 DIAGNOSIS — R1011 Right upper quadrant pain: Secondary | ICD-10-CM

## 2023-01-05 DIAGNOSIS — K838 Other specified diseases of biliary tract: Secondary | ICD-10-CM

## 2023-02-01 ENCOUNTER — Ambulatory Visit (HOSPITAL_COMMUNITY)
Admission: RE | Admit: 2023-02-01 | Discharge: 2023-02-01 | Disposition: A | Discharge status: Home / Self Care | Payer: PPO | Source: Ambulatory Visit | Attending: Gastroenterology | Admitting: Gastroenterology

## 2023-02-01 DIAGNOSIS — D509 Iron deficiency anemia, unspecified: Secondary | ICD-10-CM | POA: Insufficient documentation

## 2023-02-01 DIAGNOSIS — R748 Abnormal levels of other serum enzymes: Secondary | ICD-10-CM | POA: Diagnosis not present

## 2023-02-01 DIAGNOSIS — K838 Other specified diseases of biliary tract: Secondary | ICD-10-CM | POA: Insufficient documentation

## 2023-02-01 DIAGNOSIS — R9389 Abnormal findings on diagnostic imaging of other specified body structures: Secondary | ICD-10-CM | POA: Diagnosis not present

## 2023-02-01 DIAGNOSIS — R1310 Dysphagia, unspecified: Secondary | ICD-10-CM | POA: Insufficient documentation

## 2023-02-01 DIAGNOSIS — R1011 Right upper quadrant pain: Secondary | ICD-10-CM | POA: Diagnosis not present

## 2023-02-01 DIAGNOSIS — K224 Dyskinesia of esophagus: Secondary | ICD-10-CM | POA: Diagnosis not present

## 2023-02-01 DIAGNOSIS — Z9884 Bariatric surgery status: Secondary | ICD-10-CM | POA: Diagnosis not present

## 2023-02-01 MED ORDER — GADOBUTROL 1 MMOL/ML IV SOLN
10.0000 mL | Freq: Once | INTRAVENOUS | Status: AC | PRN
  Administered 2023-02-01: 10 mL via INTRAVENOUS

## 2023-02-28 ENCOUNTER — Encounter: Payer: Self-pay | Admitting: Podiatry

## 2023-02-28 ENCOUNTER — Ambulatory Visit: Payer: PPO | Admitting: Podiatry

## 2023-02-28 ENCOUNTER — Ambulatory Visit (INDEPENDENT_AMBULATORY_CARE_PROVIDER_SITE_OTHER): Payer: PPO

## 2023-02-28 DIAGNOSIS — M216X2 Other acquired deformities of left foot: Secondary | ICD-10-CM | POA: Diagnosis not present

## 2023-02-28 DIAGNOSIS — M216X1 Other acquired deformities of right foot: Secondary | ICD-10-CM | POA: Diagnosis not present

## 2023-02-28 NOTE — Progress Notes (Signed)
Subjective:  Patient ID: Melanie [name], female    DOB: 1956/02/03,  MRN: [id number]  Chief Complaint  Patient presents with   Callouses    Bilateral callus     67 y.o. female presents with the above complaint.  Patient presents with complaint of bilateral plantarflexed fifth metatarsal with painful submetatarsal 5 lesion.  Patient states been present for quite some time is progressive gotten worse she wanted to get it evaluated she has not seen MRIs prior to seeing me.  She is tried shoe gear modification padding offloading none of which has helped.  She would like to discuss surgical intervention.   Review of Systems: Negative except as noted in the HPI. Denies N/V/F/Ch.  Past Medical History:  Diagnosis Date   Arthritis    Barrett's esophagus    RESOLVED AS PER LAST ENDOSCOPY    Depression    Fibromyalgia    Gallstones    RESOLVED WITH REMOVAL OF GALLBLADDER    Hypertension    DX WHEN SHE WEIGHED OVER 300LB , LOST WEIGHT WITH AID OF BARIATRIC SURGERY , NOW  NO LONGER TAKES BP MEDICATION    Obesity    Pernicious anemia    RESOLVED    PONV (postoperative nausea and vomiting)     Current Outpatient Medications:    diazepam (VALIUM) 5 MG tablet, Take by mouth., Disp: , Rfl:    Difluprednate 0.05 % EMUL, Place 1 drop into the right eye 4 (four) times daily., Disp: , Rfl:    gatifloxacin (ZYMAXID) 0.5 % SOLN, Place 1 drop into the right eye 4 (four) times daily., Disp: , Rfl:    ketorolac (ACULAR) 0.5 % ophthalmic solution, Place 1 drop into the right eye 4 (four) times daily., Disp: , Rfl:    oxyCODONE (OXY IR/ROXICODONE) 5 MG immediate release tablet, Take by mouth., Disp: , Rfl:    AMBULATORY NON FORMULARY MEDICATION, 2 capsules every morning. Medication Name: Super beets, Disp: , Rfl:    Biotin 5 MG TABS, Take 2 tablets by mouth every evening., Disp: , Rfl:    CALCIUM CITRATE PO, Take 1,200 mg by mouth every evening., Disp: , Rfl:    cholecalciferol (VITAMIN D3) 25 MCG (1000  UNIT) tablet, Take 2,000 Units by mouth daily., Disp: , Rfl:    COD LIVER OIL PO, Take 400 mg by mouth daily., Disp: , Rfl:    DULoxetine (CYMBALTA) 30 MG capsule, Take 30 mg by mouth 2 (two) times daily., Disp: , Rfl:    irbesartan (AVAPRO) 150 MG tablet, Take 150 mg by mouth 2 (two) times daily., Disp: , Rfl:    omeprazole (PRILOSEC) 20 MG capsule, Take 40 mg by mouth every morning., Disp: , Rfl:    traZODone (DESYREL) 150 MG tablet, Take 1 tablet (150 mg total) by mouth at bedtime. (Patient taking differently: Take 75 mg by mouth at bedtime.), Disp: 30 tablet, Rfl: 5  Social History   Tobacco Use  Smoking Status Former   Current packs/day: 0.00   Types: Cigarettes   Quit date: 03/11/1993   Years since quitting: 29.9  Smokeless Tobacco Never    Allergies  Allergen Reactions   Ace Inhibitors Cough   Gabapentin Other (See Comments)   Nsaids Nausea And Vomiting    Stomach pain   Oxycodone Itching   Penicillins Swelling        Sulfa Antibiotics Nausea Only   Objective:  There were no vitals filed for this visit. There is no height or weight on  file to calculate BMI. Constitutional Well developed. Well nourished.  Vascular Dorsalis pedis pulses palpable bilaterally. Posterior tibial pulses palpable bilaterally. Capillary refill normal to all digits.  No cyanosis or clubbing noted. Pedal hair growth normal.  Neurologic Normal speech. Oriented to person, place, and time. Epicritic sensation to light touch grossly present bilaterally.  Dermatologic Nails well groomed and normal in appearance. No open wounds. No skin lesions.  Orthopedic: Bilateral plantarflexed fifth metatarsal with underlying plantarflexed fifth metatarsal.  Pain on palpation submetatarsal 5   Radiographs: 3 views of skeletally mature adult bilateral foot: Plantarflexed fifth metatarsal noted.  Midfoot arthritis noted. Assessment:   1. Plantar flexed metatarsal bone of right foot   2. Plantar flexed  metatarsal bone of left foot    Plan:  Patient was evaluated and treated and all questions answered.  Bilateral plantarflexed fifth metatarsal -All questions and concerns were discussed with the patient extensive detail -Given that she has failed all conservative care includes shoe gear modification padding protecting offloading she will benefit from floating osteotomy of the bilateral fifth metatarsal.  I discussed my preoperative and postoperative plan with the patient in extensive detail she states understand would like to proceed with surgery. a medicine -Informed surgical risk consent was reviewed and read aloud to the patient.  I reviewed the films.  I have discussed my findings with the patient in great detail.  I have discussed all risks including but not limited to infection, stiffness, scarring, limp, disability, deformity, damage to blood vessels and nerves, numbness, poor healing, need for braces, arthritis, chronic pain, amputation, death.  All benefits and realistic expectations discussed in great detail.  I have made no promises as to the outcome.  I have provided realistic expectations.  I have offered the patient a 2nd opinion, which they have declined and assured me they preferred to proceed despite the risks No follow-ups on file.

## 2023-03-12 ENCOUNTER — Telehealth: Payer: Self-pay | Admitting: Urology

## 2023-03-12 NOTE — Telephone Encounter (Signed)
DOS - 04/02/23  METATARSAL OSTEOTOMY 5TH BILAT --- [id number]  [facility]  RECEIVED A FAX FROM [facility] STATING THAT CPT CODE 03474 HAS BEEN APPROVED, AUTH # [id number],GOOD FROM 04/02/23 - 07/01/23.

## 2023-04-02 ENCOUNTER — Other Ambulatory Visit: Payer: Self-pay | Admitting: Podiatry

## 2023-04-02 DIAGNOSIS — M216X2 Other acquired deformities of left foot: Secondary | ICD-10-CM | POA: Diagnosis not present

## 2023-04-02 DIAGNOSIS — M205X1 Other deformities of toe(s) (acquired), right foot: Secondary | ICD-10-CM | POA: Diagnosis not present

## 2023-04-02 DIAGNOSIS — M216X1 Other acquired deformities of right foot: Secondary | ICD-10-CM | POA: Diagnosis not present

## 2023-04-02 DIAGNOSIS — M205X2 Other deformities of toe(s) (acquired), left foot: Secondary | ICD-10-CM | POA: Diagnosis not present

## 2023-04-02 MED ORDER — ONDANSETRON HCL 4 MG PO TABS: 4.0000 mg | ORAL_TABLET | Freq: Three times a day (TID) | ORAL | 0 refills | Status: DC | PRN

## 2023-04-02 MED ORDER — HYDROCODONE-ACETAMINOPHEN 5-325 MG PO TABS: 1.0000 | ORAL_TABLET | Freq: Four times a day (QID) | ORAL | 0 refills | Status: DC | PRN

## 2023-04-02 MED ORDER — IBUPROFEN 800 MG PO TABS: 800.0000 mg | ORAL_TABLET | Freq: Four times a day (QID) | ORAL | 1 refills | Status: DC | PRN

## 2023-04-06 ENCOUNTER — Telehealth: Payer: Self-pay

## 2023-04-06 NOTE — Telephone Encounter (Signed)
Patient called, concerned about bruising and swelling in her right foot, more so than her left. Her pain is very manageable, she hasn't had to take any of the pain medicine. Advised that the bruising and swelling are normal, especially if she has been up and moving around a lot. She admitted to doing more than she should, it's hard for her to sit still. Advised to elevate when possible and to use ice a few times a day. She will continue to monitor and try to stay off her feet a little more. Advised to call back with any further questions or concerns.

## 2023-04-11 ENCOUNTER — Ambulatory Visit (INDEPENDENT_AMBULATORY_CARE_PROVIDER_SITE_OTHER): Payer: PPO

## 2023-04-11 ENCOUNTER — Encounter: Payer: Self-pay | Admitting: Podiatry

## 2023-04-11 ENCOUNTER — Ambulatory Visit: Payer: PPO | Admitting: Podiatry

## 2023-04-11 DIAGNOSIS — Z9889 Other specified postprocedural states: Secondary | ICD-10-CM

## 2023-04-11 DIAGNOSIS — M216X1 Other acquired deformities of right foot: Secondary | ICD-10-CM

## 2023-04-11 DIAGNOSIS — M216X2 Other acquired deformities of left foot: Secondary | ICD-10-CM

## 2023-04-11 MED ORDER — DOXYCYCLINE HYCLATE 100 MG PO TABS: 100.0000 mg | ORAL_TABLET | Freq: Two times a day (BID) | ORAL | 0 refills | Status: AC

## 2023-04-11 NOTE — Progress Notes (Signed)
Subjective:  Patient ID: Melanie [name], female    DOB: 23-May-1956,  MRN: [id number]  Chief Complaint  Patient presents with   Routine Post Op    POV #1 DOS 04/12/23 -- BIALATERAL FLOATING OSTEOTOMY FIFTH    DOS: 04/12/2023 Procedure: Bilateral fifth floating osteotomy  67 y.o. female returns for post-op check.  Patient states that she is doing well.  Minimal pain.  Bilateral surgical shoe ambulation.  Bandages clean dry and intact  Review of Systems: Negative except as noted in the HPI. Denies N/V/F/Ch.  Past Medical History:  Diagnosis Date   Arthritis    Barrett's esophagus    RESOLVED AS PER LAST ENDOSCOPY    Depression    Fibromyalgia    Gallstones    RESOLVED WITH REMOVAL OF GALLBLADDER    Hypertension    DX WHEN SHE WEIGHED OVER 300LB , LOST WEIGHT WITH AID OF BARIATRIC SURGERY , NOW  NO LONGER TAKES BP MEDICATION    Obesity    Pernicious anemia    RESOLVED    PONV (postoperative nausea and vomiting)     Current Outpatient Medications:    doxycycline (VIBRA-TABS) 100 MG tablet, Take 1 tablet (100 mg total) by mouth 2 (two) times daily for 10 days., Disp: 20 tablet, Rfl: 0   AMBULATORY NON FORMULARY MEDICATION, 2 capsules every morning. Medication Name: Super beets, Disp: , Rfl:    Biotin 5 MG TABS, Take 2 tablets by mouth every evening., Disp: , Rfl:    CALCIUM CITRATE PO, Take 1,200 mg by mouth every evening., Disp: , Rfl:    cholecalciferol (VITAMIN D3) 25 MCG (1000 UNIT) tablet, Take 2,000 Units by mouth daily., Disp: , Rfl:    COD LIVER OIL PO, Take 400 mg by mouth daily., Disp: , Rfl:    diazepam (VALIUM) 5 MG tablet, Take by mouth., Disp: , Rfl:    DULoxetine (CYMBALTA) 30 MG capsule, Take 30 mg by mouth 2 (two) times daily., Disp: , Rfl:    irbesartan (AVAPRO) 150 MG tablet, Take 150 mg by mouth 2 (two) times daily., Disp: , Rfl:    omeprazole (PRILOSEC) 20 MG capsule, Take 40 mg by mouth every morning., Disp: , Rfl:    traZODone (DESYREL) 150 MG tablet, Take  1 tablet (150 mg total) by mouth at bedtime. (Patient taking differently: Take 75 mg by mouth at bedtime.), Disp: 30 tablet, Rfl: 5  Social History   Tobacco Use  Smoking Status Former   Current packs/day: 0.00   Types: Cigarettes   Quit date: 03/11/1993   Years since quitting: 30.1  Smokeless Tobacco Never    Allergies  Allergen Reactions   Ace Inhibitors Cough   Gabapentin Other (See Comments)   Nsaids Nausea And Vomiting    Stomach pain   Oxycodone Itching   Penicillins Swelling        Sulfa Antibiotics Nausea Only   Objective:  There were no vitals filed for this visit. There is no height or weight on file to calculate BMI. Constitutional Well developed. Well nourished.  Vascular Foot warm and well perfused. Capillary refill normal to all digits.   Neurologic Normal speech. Oriented to person, place, and time. Epicritic sensation to light touch grossly present bilaterally.  Dermatologic Skin healing well without signs of infection. Skin edges well coapted without signs of infection.  Orthopedic: Tenderness to palpation noted about the surgical site.   Radiographs: 3 views of the bilateral foot: Bilateral osteotomy noted of the fifth metatarsal.  Good correction alignment noted reduction of deformity noted Assessment:   1. Plantar flexed metatarsal bone of right foot   2. Plantar flexed metatarsal bone of left foot   3. Status post foot surgery    Plan:  Patient was evaluated and treated and all questions answered.  S/p foot surgery bilaterally -Progressing as expected post-operatively. -XR: None -WB Status: Weightbearing as tolerated in surgical shoe -Sutures: Intact.  No clinical signs of dehiscence, no complication noted. -Medications: None -Foot redressed.  No follow-ups on file.

## 2023-04-25 ENCOUNTER — Ambulatory Visit (INDEPENDENT_AMBULATORY_CARE_PROVIDER_SITE_OTHER): Payer: PPO | Admitting: Podiatry

## 2023-04-25 ENCOUNTER — Encounter: Payer: Self-pay | Admitting: Podiatry

## 2023-04-25 ENCOUNTER — Ambulatory Visit (INDEPENDENT_AMBULATORY_CARE_PROVIDER_SITE_OTHER): Payer: PPO

## 2023-04-25 VITALS — BP 168/108 | HR 82

## 2023-04-25 DIAGNOSIS — Z9889 Other specified postprocedural states: Secondary | ICD-10-CM | POA: Diagnosis not present

## 2023-04-25 DIAGNOSIS — M216X2 Other acquired deformities of left foot: Secondary | ICD-10-CM

## 2023-04-25 DIAGNOSIS — M216X1 Other acquired deformities of right foot: Secondary | ICD-10-CM

## 2023-04-25 NOTE — Progress Notes (Signed)
Subjective:  Patient ID: Melanie [name], female    DOB: Jan 27, 1956,  MRN: [id number]  Chief Complaint  Patient presents with   Routine Post Op    Follow up visit for post OP    DOS: 04/12/2023 Procedure: Bilateral fifth floating osteotomy  67 y.o. female returns for post-op check.  Patient states that she is doing well.  Minimal pain.  Bilateral surgical shoe ambulation.  Bandages clean dry and intact  Review of Systems: Negative except as noted in the HPI. Denies N/V/F/Ch.  Past Medical History:  Diagnosis Date   Arthritis    Barrett's esophagus    RESOLVED AS PER LAST ENDOSCOPY    Depression    Fibromyalgia    Gallstones    RESOLVED WITH REMOVAL OF GALLBLADDER    Hypertension    DX WHEN SHE WEIGHED OVER 300LB , LOST WEIGHT WITH AID OF BARIATRIC SURGERY , NOW  NO LONGER TAKES BP MEDICATION    Obesity    Pernicious anemia    RESOLVED    PONV (postoperative nausea and vomiting)     Current Outpatient Medications:    AMBULATORY NON FORMULARY MEDICATION, 2 capsules every morning. Medication Name: Super beets, Disp: , Rfl:    Biotin 5 MG TABS, Take 2 tablets by mouth every evening., Disp: , Rfl:    CALCIUM CITRATE PO, Take 1,200 mg by mouth every evening., Disp: , Rfl:    cholecalciferol (VITAMIN D3) 25 MCG (1000 UNIT) tablet, Take 2,000 Units by mouth daily., Disp: , Rfl:    COD LIVER OIL PO, Take 400 mg by mouth daily., Disp: , Rfl:    diazepam (VALIUM) 5 MG tablet, Take by mouth., Disp: , Rfl:    DULoxetine (CYMBALTA) 30 MG capsule, Take 30 mg by mouth 2 (two) times daily., Disp: , Rfl:    irbesartan (AVAPRO) 150 MG tablet, Take 150 mg by mouth 2 (two) times daily., Disp: , Rfl:    omeprazole (PRILOSEC) 20 MG capsule, Take 40 mg by mouth every morning., Disp: , Rfl:    traZODone (DESYREL) 150 MG tablet, Take 1 tablet (150 mg total) by mouth at bedtime. (Patient taking differently: Take 75 mg by mouth at bedtime.), Disp: 30 tablet, Rfl: 5  Social History   Tobacco Use   Smoking Status Former   Current packs/day: 0.00   Types: Cigarettes   Quit date: 03/11/1993   Years since quitting: 30.1  Smokeless Tobacco Never    Allergies  Allergen Reactions   Ace Inhibitors Cough   Gabapentin Other (See Comments)   Nsaids Nausea And Vomiting    Stomach pain   Oxycodone Itching   Penicillins Swelling        Sulfa Antibiotics Nausea Only   Objective:   Vitals:   04/25/23 1402  BP: (!) 168/108  Pulse: 82   There is no height or weight on file to calculate BMI. Constitutional Well developed. Well nourished.  Vascular Foot warm and well perfused. Capillary refill normal to all digits.   Neurologic Normal speech. Oriented to person, place, and time. Epicritic sensation to light touch grossly present bilaterally.  Dermatologic Skin completely epithelialized.  No signs of dehiscence noted no complication noted good reduction of deformity noted.  Orthopedic: No further tenderness to palpation noted about the surgical site.   Radiographs: 3 views of the bilateral foot: Bilateral osteotomy noted of the fifth metatarsal.  Good correction alignment noted reduction of deformity noted Assessment:   1. Status post surgery   2. Plantar  flexed metatarsal bone of right foot   3. Plantar flexed metatarsal bone of left foot    Plan:  Patient was evaluated and treated and all questions answered.  S/p foot surgery bilaterally -Clinically healed and officially discharged from my care.  If any foot and ankle issues arise in the future. She can return to regular shoes regular activities if any   No follow-ups on file.

## 2023-05-16 DIAGNOSIS — I10 Essential (primary) hypertension: Secondary | ICD-10-CM | POA: Diagnosis not present

## 2023-05-16 DIAGNOSIS — D500 Iron deficiency anemia secondary to blood loss (chronic): Secondary | ICD-10-CM | POA: Diagnosis not present

## 2023-05-16 DIAGNOSIS — E559 Vitamin D deficiency, unspecified: Secondary | ICD-10-CM | POA: Diagnosis not present

## 2023-05-21 DIAGNOSIS — R748 Abnormal levels of other serum enzymes: Secondary | ICD-10-CM | POA: Diagnosis not present

## 2023-05-21 DIAGNOSIS — Z0000 Encounter for general adult medical examination without abnormal findings: Secondary | ICD-10-CM | POA: Diagnosis not present

## 2023-05-21 DIAGNOSIS — I10 Essential (primary) hypertension: Secondary | ICD-10-CM | POA: Diagnosis not present

## 2023-05-21 DIAGNOSIS — Z23 Encounter for immunization: Secondary | ICD-10-CM | POA: Diagnosis not present

## 2023-06-22 DIAGNOSIS — I10 Essential (primary) hypertension: Secondary | ICD-10-CM | POA: Diagnosis not present

## 2023-10-17 DIAGNOSIS — M159 Polyosteoarthritis, unspecified: Secondary | ICD-10-CM | POA: Diagnosis not present

## 2023-10-17 DIAGNOSIS — M1990 Unspecified osteoarthritis, unspecified site: Secondary | ICD-10-CM | POA: Diagnosis not present

## 2023-10-18 ENCOUNTER — Other Ambulatory Visit (HOSPITAL_COMMUNITY): Payer: Self-pay | Admitting: Registered Nurse

## 2023-10-18 DIAGNOSIS — Z1231 Encounter for screening mammogram for malignant neoplasm of breast: Secondary | ICD-10-CM

## 2023-10-23 DIAGNOSIS — M25512 Pain in left shoulder: Secondary | ICD-10-CM | POA: Diagnosis not present

## 2023-10-23 DIAGNOSIS — M79642 Pain in left hand: Secondary | ICD-10-CM | POA: Diagnosis not present

## 2023-10-23 DIAGNOSIS — M19049 Primary osteoarthritis, unspecified hand: Secondary | ICD-10-CM | POA: Diagnosis not present

## 2023-10-23 DIAGNOSIS — M79672 Pain in left foot: Secondary | ICD-10-CM | POA: Diagnosis not present

## 2023-10-23 DIAGNOSIS — G43909 Migraine, unspecified, not intractable, without status migrainosus: Secondary | ICD-10-CM | POA: Diagnosis not present

## 2023-10-23 DIAGNOSIS — M25572 Pain in left ankle and joints of left foot: Secondary | ICD-10-CM | POA: Diagnosis not present

## 2023-10-23 DIAGNOSIS — I10 Essential (primary) hypertension: Secondary | ICD-10-CM | POA: Diagnosis not present

## 2023-10-23 DIAGNOSIS — M79641 Pain in right hand: Secondary | ICD-10-CM | POA: Diagnosis not present

## 2023-10-23 DIAGNOSIS — M79643 Pain in unspecified hand: Secondary | ICD-10-CM | POA: Diagnosis not present

## 2023-10-23 DIAGNOSIS — M1990 Unspecified osteoarthritis, unspecified site: Secondary | ICD-10-CM | POA: Diagnosis not present

## 2023-10-23 DIAGNOSIS — M25511 Pain in right shoulder: Secondary | ICD-10-CM | POA: Diagnosis not present

## 2023-10-23 DIAGNOSIS — E669 Obesity, unspecified: Secondary | ICD-10-CM | POA: Diagnosis not present

## 2023-10-23 DIAGNOSIS — M25552 Pain in left hip: Secondary | ICD-10-CM | POA: Diagnosis not present

## 2023-10-23 DIAGNOSIS — M79671 Pain in right foot: Secondary | ICD-10-CM | POA: Diagnosis not present

## 2023-10-23 DIAGNOSIS — M25571 Pain in right ankle and joints of right foot: Secondary | ICD-10-CM | POA: Diagnosis not present

## 2023-10-23 DIAGNOSIS — M25551 Pain in right hip: Secondary | ICD-10-CM | POA: Diagnosis not present

## 2023-10-30 DIAGNOSIS — L0211 Cutaneous abscess of neck: Secondary | ICD-10-CM | POA: Diagnosis not present

## 2023-11-01 ENCOUNTER — Encounter (HOSPITAL_COMMUNITY): Payer: Self-pay

## 2023-11-01 ENCOUNTER — Ambulatory Visit (HOSPITAL_COMMUNITY)
Admission: RE | Admit: 2023-11-01 | Discharge: 2023-11-01 | Disposition: A | Discharge status: Home / Self Care | Source: Ambulatory Visit | Attending: Registered Nurse | Admitting: Registered Nurse

## 2023-11-01 DIAGNOSIS — Z1231 Encounter for screening mammogram for malignant neoplasm of breast: Secondary | ICD-10-CM | POA: Insufficient documentation

## 2023-11-21 DIAGNOSIS — E669 Obesity, unspecified: Secondary | ICD-10-CM | POA: Diagnosis not present

## 2023-11-21 DIAGNOSIS — I10 Essential (primary) hypertension: Secondary | ICD-10-CM | POA: Diagnosis not present

## 2023-11-21 DIAGNOSIS — M79643 Pain in unspecified hand: Secondary | ICD-10-CM | POA: Diagnosis not present

## 2023-11-21 DIAGNOSIS — M19049 Primary osteoarthritis, unspecified hand: Secondary | ICD-10-CM | POA: Diagnosis not present

## 2023-11-21 DIAGNOSIS — G43909 Migraine, unspecified, not intractable, without status migrainosus: Secondary | ICD-10-CM | POA: Diagnosis not present

## 2023-11-21 DIAGNOSIS — M1990 Unspecified osteoarthritis, unspecified site: Secondary | ICD-10-CM | POA: Diagnosis not present

## 2023-12-26 ENCOUNTER — Ambulatory Visit: Admitting: Podiatry

## 2023-12-26 DIAGNOSIS — L603 Nail dystrophy: Secondary | ICD-10-CM

## 2023-12-26 NOTE — Progress Notes (Signed)
 Subjective:  Patient ID: Melanie [name], female    DOB: 08/17/1955,  MRN: [id number]  Chief Complaint  Patient presents with   Nail Problem    Wants to have her big toe nail looked at, she stated that she lost the left one a few weeks ago     68 y.o. female presents with the above complaint.  Patient presents with complaint of left hallux nail avulsion.  Patient states that this came out.  She did a lot of walking and may have damaged the nail.  She denies any other acute complaints.  It is not causing her any pain and has healed over.  Pain scale is 0 out of 10.  Denies any other acute issues   Review of Systems: Negative except as noted in the HPI. Denies N/V/F/Ch.  Past Medical History:  Diagnosis Date   Arthritis    Barrett's esophagus    RESOLVED AS PER LAST ENDOSCOPY    Depression    Fibromyalgia    Gallstones    RESOLVED WITH REMOVAL OF GALLBLADDER    Hypertension    DX WHEN SHE WEIGHED OVER 300LB , LOST WEIGHT WITH AID OF BARIATRIC SURGERY , NOW  NO LONGER TAKES BP MEDICATION    Obesity    Pernicious anemia    RESOLVED    PONV (postoperative nausea and vomiting)     Current Outpatient Medications:    Plecanatide (TRULANCE) 3 MG TABS, 1 tablet Orally Once a day for 30 days, Disp: , Rfl:    triamterene-hydrochlorothiazide (MAXZIDE-25) 37.5-25 MG tablet, 1/2 tablet in the morning Orally Once a day for 90 days, Disp: , Rfl:    Biotin 5 MG TABS, Take 2 tablets by mouth every evening., Disp: , Rfl:    CALCIUM CITRATE PO, Take 1,200 mg by mouth every evening., Disp: , Rfl:    cholecalciferol (VITAMIN D3) 25 MCG (1000 UNIT) tablet, Take 2,000 Units by mouth daily., Disp: , Rfl:    Collagen-Vitamin C-Biotin (COLLAGEN 1500/C) 500-50-0.8 MG CAPS, as directed Orally, Disp: , Rfl:    diazepam (VALIUM) 5 MG tablet, Take by mouth., Disp: , Rfl:    DULoxetine (CYMBALTA) 30 MG capsule, Take 30 mg by mouth 2 (two) times daily., Disp: , Rfl:    hydroxychloroquine (PLAQUENIL) 200 MG  tablet, Take 200 mg by mouth 2 (two) times daily., Disp: , Rfl:    irbesartan (AVAPRO) 150 MG tablet, Take 150 mg by mouth 2 (two) times daily., Disp: , Rfl:    predniSONE (DELTASONE) 5 MG tablet, Take 10 mg by mouth daily., Disp: , Rfl:    traZODone  (DESYREL ) 150 MG tablet, Take 1 tablet (150 mg total) by mouth at bedtime. (Patient taking differently: Take 75 mg by mouth at bedtime.), Disp: 30 tablet, Rfl: 5  Social History   Tobacco Use  Smoking Status Former   Current packs/day: 0.00   Types: Cigarettes   Quit date: 03/11/1993   Years since quitting: 30.8  Smokeless Tobacco Never    Allergies  Allergen Reactions   Ace Inhibitors Cough   Gabapentin  Other (See Comments)   Nsaids Nausea And Vomiting    Stomach pain   Oxycodone  Itching   Penicillins Swelling        Sulfa Antibiotics Nausea Only   Objective:  There were no vitals filed for this visit. There is no height or weight on file to calculate BMI. Constitutional Well developed. Well nourished.  Vascular Dorsalis pedis pulses palpable bilaterally. Posterior tibial pulses palpable bilaterally.  Capillary refill normal to all digits.  No cyanosis or clubbing noted. Pedal hair growth normal.  Neurologic Normal speech. Oriented to person, place, and time. Epicritic sensation to light touch grossly present bilaterally.  Dermatologic Left hallux self avulsion of the nail noted healed underlying wound bed noted.  No signs of infection noted.  No new nail growth noted yet  Orthopedic: Normal joint ROM without pain or crepitus bilaterally. No visible deformities. No bony tenderness.   Radiographs: None Assessment:   1. Nail dystrophy    Plan:  Patient was evaluated and treated and all questions answered.  Left hallux self avulsion - All questions and concerns were discussed with the patient extensive detail.  Patient may have experienced severe microtrauma leading to self avulsion of the nail.  The underlying nailbed  has healed.  At this time I discussed with her continue clinically monitoring if any foot and ankle issues on future she will come back and see me.  No follow-ups on file.

## 2024-01-11 ENCOUNTER — Ambulatory Visit: Admitting: Podiatry

## 2024-03-05 DIAGNOSIS — M1990 Unspecified osteoarthritis, unspecified site: Secondary | ICD-10-CM | POA: Diagnosis not present

## 2024-03-05 DIAGNOSIS — Z79899 Other long term (current) drug therapy: Secondary | ICD-10-CM | POA: Diagnosis not present

## 2024-03-05 DIAGNOSIS — M0609 Rheumatoid arthritis without rheumatoid factor, multiple sites: Secondary | ICD-10-CM | POA: Diagnosis not present

## 2024-03-20 DIAGNOSIS — M1990 Unspecified osteoarthritis, unspecified site: Secondary | ICD-10-CM | POA: Diagnosis not present

## 2024-05-19 DIAGNOSIS — R7989 Other specified abnormal findings of blood chemistry: Secondary | ICD-10-CM | POA: Diagnosis not present

## 2024-05-19 DIAGNOSIS — D500 Iron deficiency anemia secondary to blood loss (chronic): Secondary | ICD-10-CM | POA: Diagnosis not present

## 2024-05-19 DIAGNOSIS — Z0000 Encounter for general adult medical examination without abnormal findings: Secondary | ICD-10-CM | POA: Diagnosis not present

## 2024-05-26 ENCOUNTER — Other Ambulatory Visit (HOSPITAL_COMMUNITY): Payer: Self-pay | Admitting: Registered Nurse

## 2024-05-26 DIAGNOSIS — I10 Essential (primary) hypertension: Secondary | ICD-10-CM | POA: Diagnosis not present

## 2024-05-26 DIAGNOSIS — H6123 Impacted cerumen, bilateral: Secondary | ICD-10-CM | POA: Diagnosis not present

## 2024-05-26 DIAGNOSIS — Z0000 Encounter for general adult medical examination without abnormal findings: Secondary | ICD-10-CM | POA: Diagnosis not present

## 2024-05-26 DIAGNOSIS — N3000 Acute cystitis without hematuria: Secondary | ICD-10-CM | POA: Diagnosis not present

## 2024-05-26 DIAGNOSIS — E559 Vitamin D deficiency, unspecified: Secondary | ICD-10-CM | POA: Diagnosis not present

## 2024-05-26 DIAGNOSIS — Z23 Encounter for immunization: Secondary | ICD-10-CM | POA: Diagnosis not present

## 2024-05-26 DIAGNOSIS — M8589 Other specified disorders of bone density and structure, multiple sites: Secondary | ICD-10-CM | POA: Diagnosis not present

## 2024-05-26 DIAGNOSIS — Z8249 Family history of ischemic heart disease and other diseases of the circulatory system: Secondary | ICD-10-CM

## 2024-05-26 DIAGNOSIS — E669 Obesity, unspecified: Secondary | ICD-10-CM | POA: Diagnosis not present

## 2024-05-26 DIAGNOSIS — Z780 Asymptomatic menopausal state: Secondary | ICD-10-CM | POA: Diagnosis not present

## 2024-05-26 DIAGNOSIS — D500 Iron deficiency anemia secondary to blood loss (chronic): Secondary | ICD-10-CM | POA: Diagnosis not present

## 2024-08-07 ENCOUNTER — Encounter: Payer: Self-pay | Admitting: Gastroenterology

## 2024-08-28 ENCOUNTER — Encounter

## 2024-09-11 ENCOUNTER — Encounter: Admitting: Gastroenterology
